# Patient Record
Sex: Female | Born: 1985 | Race: White | Hispanic: No | Smoking: Current every day smoker
Health system: Southern US, Community
[De-identification: ages and names within clinical notes are randomized; demographics above are authoritative.]

## PROBLEM LIST (undated history)

## (undated) DIAGNOSIS — F1020 Alcohol dependence, uncomplicated: Secondary | ICD-10-CM

## (undated) DIAGNOSIS — Z789 Other specified health status: Secondary | ICD-10-CM

## (undated) DIAGNOSIS — D649 Anemia, unspecified: Secondary | ICD-10-CM

## (undated) DIAGNOSIS — F419 Anxiety disorder, unspecified: Secondary | ICD-10-CM

## (undated) DIAGNOSIS — F32A Depression, unspecified: Secondary | ICD-10-CM

## (undated) DIAGNOSIS — E669 Obesity, unspecified: Secondary | ICD-10-CM

## (undated) HISTORY — DX: Obesity, unspecified: E66.9

## (undated) HISTORY — DX: Anemia, unspecified: D64.9

## (undated) HISTORY — DX: Depression, unspecified: F32.A

## (undated) HISTORY — PX: WISDOM TOOTH EXTRACTION: SHX21

## (undated) HISTORY — PX: NO PAST SURGERIES: SHX2092

## (undated) HISTORY — DX: Alcohol dependence, uncomplicated: F10.20

## (undated) HISTORY — DX: Anxiety disorder, unspecified: F41.9

---
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
## 2006-04-04 ENCOUNTER — Emergency Department (HOSPITAL_COMMUNITY): Admission: EM | Admit: 2006-04-04 | Discharge: 2006-04-05 | Payer: Self-pay | Admitting: Emergency Medicine

## 2006-09-07 ENCOUNTER — Emergency Department (HOSPITAL_COMMUNITY): Admission: EM | Admit: 2006-09-07 | Discharge: 2006-09-07 | Payer: Self-pay | Admitting: Emergency Medicine

## 2008-03-18 ENCOUNTER — Emergency Department (HOSPITAL_COMMUNITY): Admission: EM | Admit: 2008-03-18 | Discharge: 2008-03-18 | Payer: Self-pay | Admitting: Emergency Medicine

## 2008-03-20 ENCOUNTER — Emergency Department (HOSPITAL_COMMUNITY): Admission: EM | Admit: 2008-03-20 | Discharge: 2008-03-20 | Payer: Self-pay | Admitting: Emergency Medicine

## 2008-08-16 ENCOUNTER — Emergency Department (HOSPITAL_COMMUNITY): Admission: EM | Admit: 2008-08-16 | Discharge: 2008-08-16 | Payer: Self-pay | Admitting: Family Medicine

## 2008-08-18 ENCOUNTER — Emergency Department (HOSPITAL_COMMUNITY): Admission: EM | Admit: 2008-08-18 | Discharge: 2008-08-18 | Payer: Self-pay | Admitting: Emergency Medicine

## 2008-08-19 ENCOUNTER — Emergency Department (HOSPITAL_COMMUNITY): Admission: EM | Admit: 2008-08-19 | Discharge: 2008-08-19 | Payer: Self-pay | Admitting: *Deleted

## 2009-01-05 ENCOUNTER — Ambulatory Visit: Payer: Self-pay | Admitting: Infectious Diseases

## 2009-01-05 DIAGNOSIS — F411 Generalized anxiety disorder: Secondary | ICD-10-CM | POA: Insufficient documentation

## 2009-01-05 DIAGNOSIS — F32A Depression, unspecified: Secondary | ICD-10-CM | POA: Insufficient documentation

## 2009-01-05 DIAGNOSIS — J329 Chronic sinusitis, unspecified: Secondary | ICD-10-CM | POA: Insufficient documentation

## 2009-01-05 DIAGNOSIS — F329 Major depressive disorder, single episode, unspecified: Secondary | ICD-10-CM | POA: Insufficient documentation

## 2009-01-05 DIAGNOSIS — L659 Nonscarring hair loss, unspecified: Secondary | ICD-10-CM | POA: Insufficient documentation

## 2009-01-05 DIAGNOSIS — F419 Anxiety disorder, unspecified: Secondary | ICD-10-CM | POA: Insufficient documentation

## 2009-01-05 LAB — CONVERTED CEMR LAB
MCHC: 33.3 g/dL (ref 30.0–36.0)
RBC: 4.8 M/uL (ref 3.87–5.11)
RDW: 12.7 % (ref 11.5–15.5)
TSH: 1.103 microintl units/mL (ref 0.350–4.5)

## 2009-01-11 ENCOUNTER — Telehealth: Payer: Self-pay | Admitting: Licensed Clinical Social Worker

## 2009-01-23 ENCOUNTER — Encounter (INDEPENDENT_AMBULATORY_CARE_PROVIDER_SITE_OTHER): Payer: Self-pay | Admitting: Internal Medicine

## 2009-05-25 ENCOUNTER — Emergency Department (HOSPITAL_COMMUNITY): Admission: EM | Admit: 2009-05-25 | Discharge: 2009-05-25 | Payer: Self-pay | Admitting: Emergency Medicine

## 2009-05-26 ENCOUNTER — Emergency Department (HOSPITAL_COMMUNITY): Admission: EM | Admit: 2009-05-26 | Discharge: 2009-05-26 | Payer: Self-pay | Admitting: Family Medicine

## 2010-07-03 LAB — POCT RAPID STREP A (OFFICE): Streptococcus, Group A Screen (Direct): NEGATIVE

## 2010-10-07 IMAGING — CT CT HEAD W/O CM
1 series · 16 of 30 positions shown, 20 images · non-contrast
Comparison: None available

CLINICAL DATA: Headache

CT HEAD WITHOUT CONTRAST
TECHNIQUE: Contiguous axial images were obtained from the base of
the skull through the vertex without contrast.

[Series 2: head routine 4.8 h37s · axial · 0.43mm/px · z∈[-170,-8]mm · 16 of 36 slices shown, 20 images]
[im 2/36  brain]
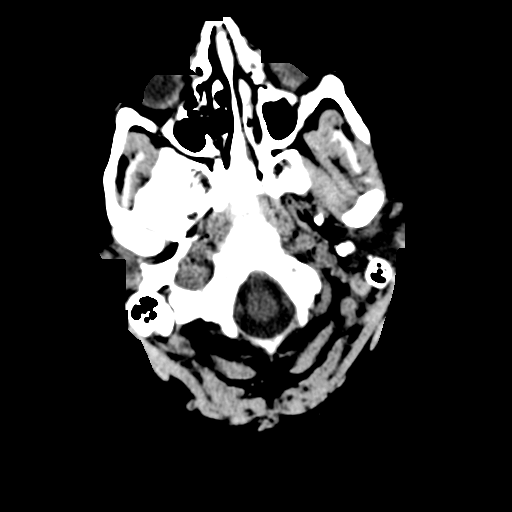
[im 2/36  bone]
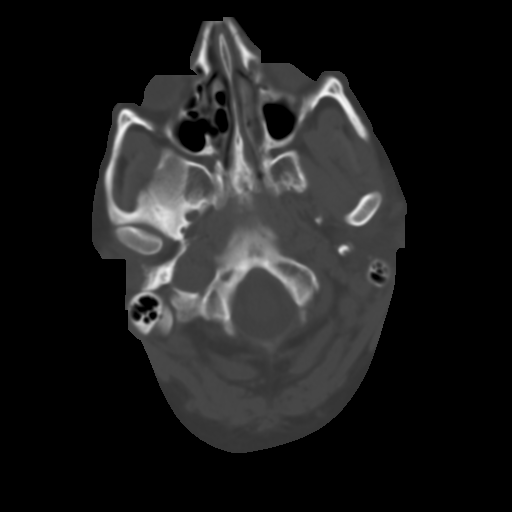
[im 4/36  brain]
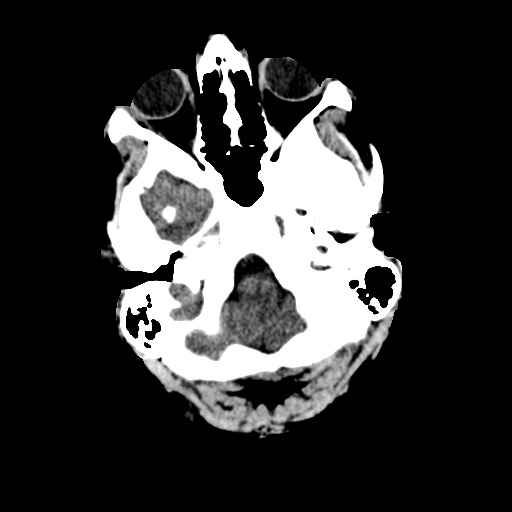
[im 7/36  brain]
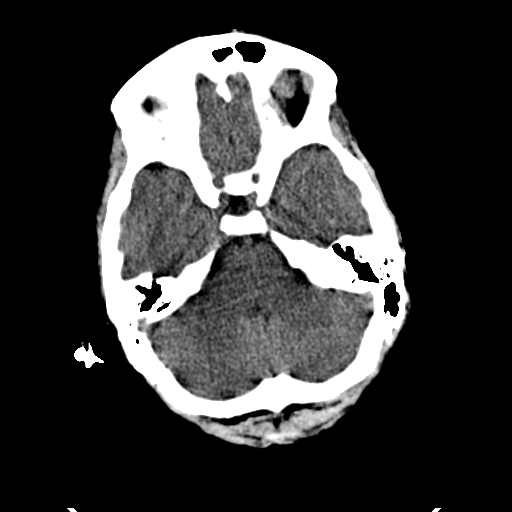
[im 9/36  brain]
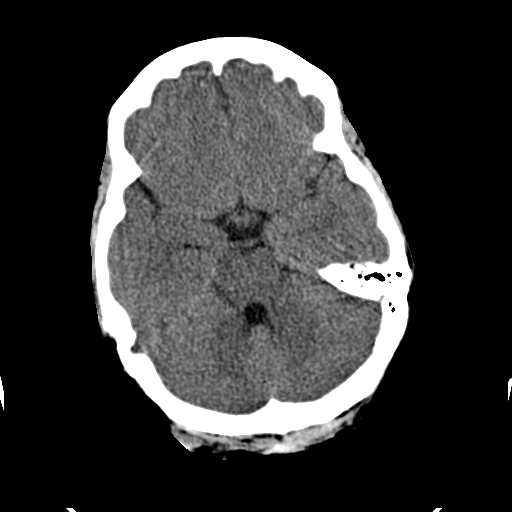
[im 10/36  brain]
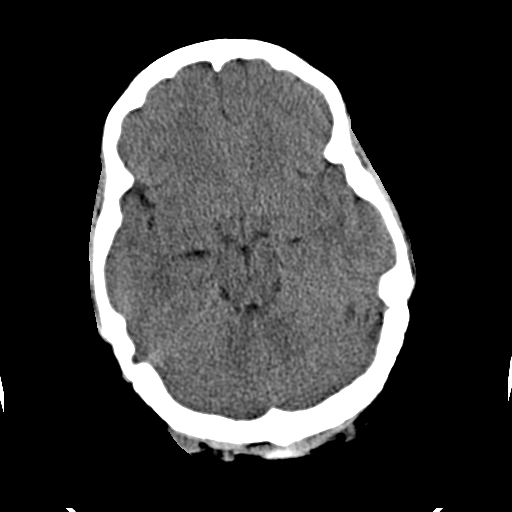
[im 10/36  bone]
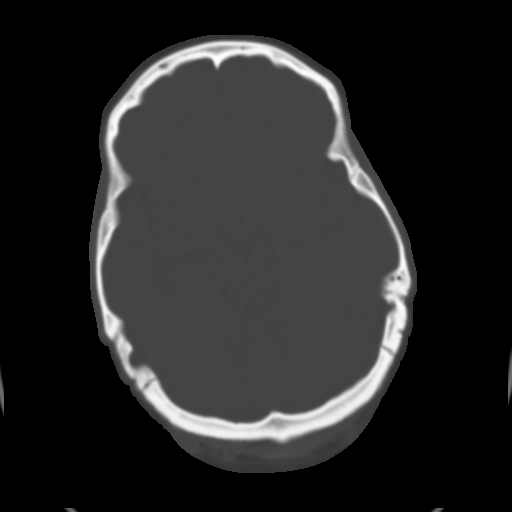
[im 13/36  brain]
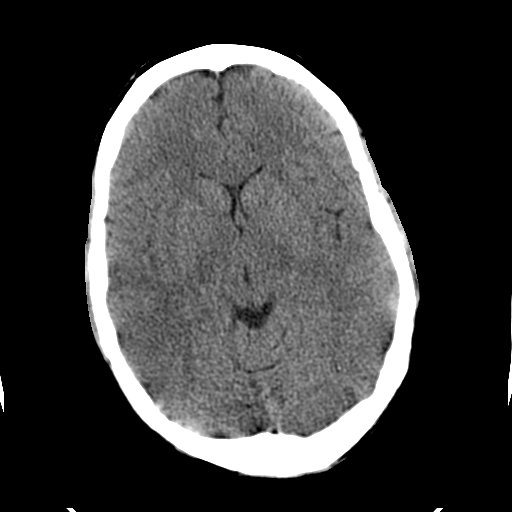
[im 15/36  brain]
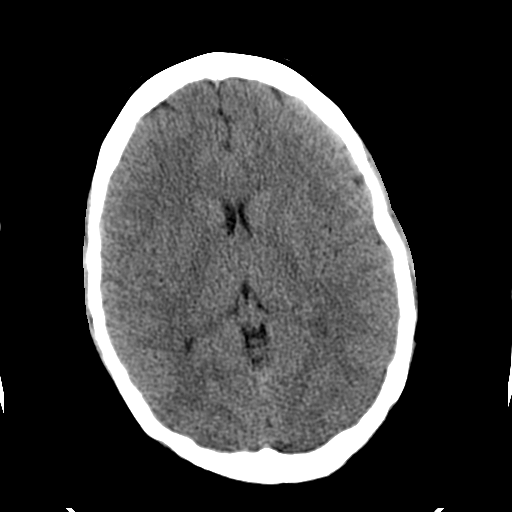
[im 17/36  brain]
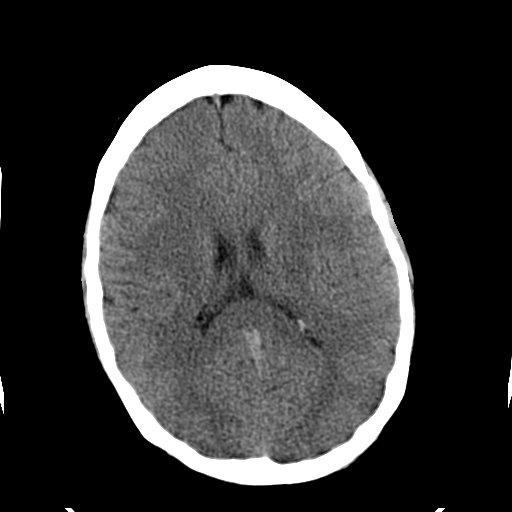
[im 19/36  brain]
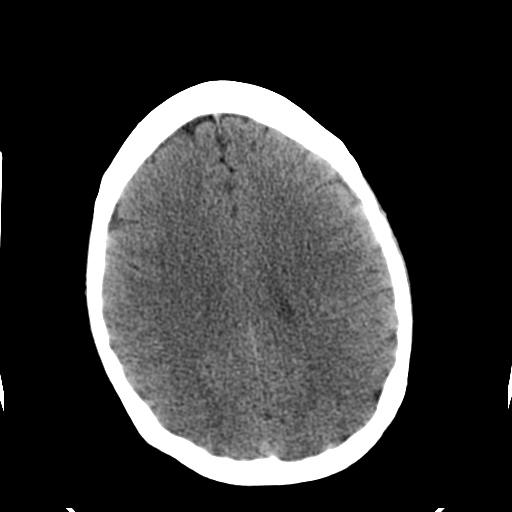
[im 19/36  bone]
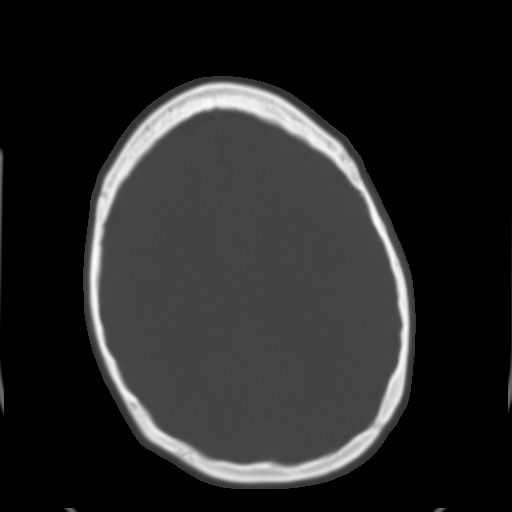
[im 21/36  brain]
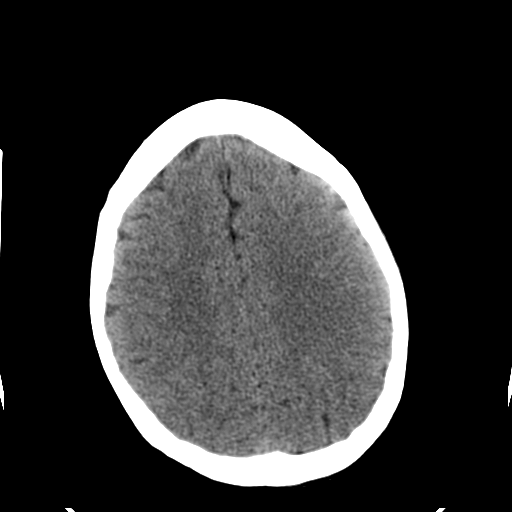
[im 23/36  brain]
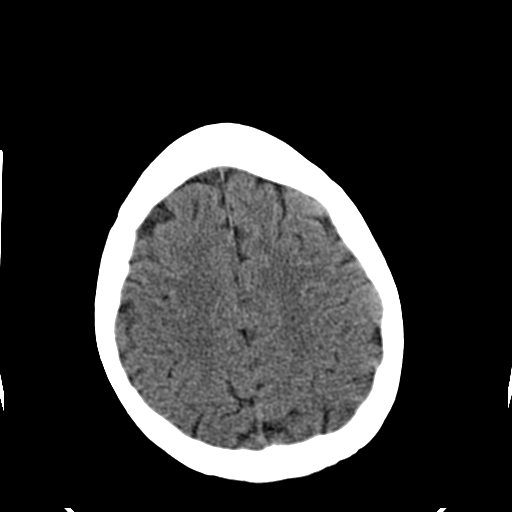
[im 26/36  brain]
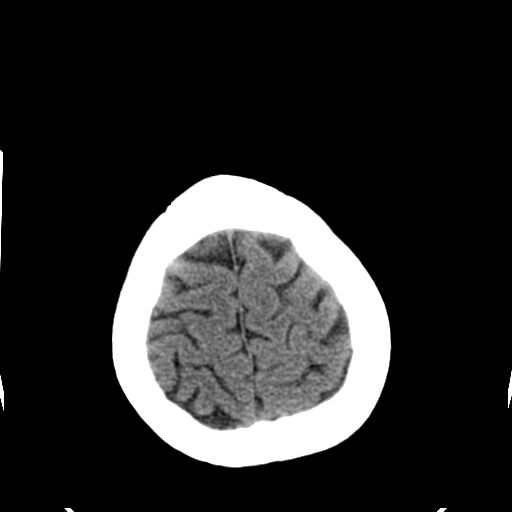
[im 27/36  brain]
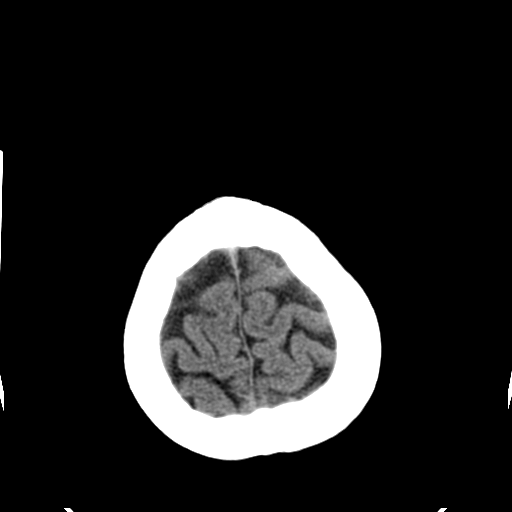
[im 27/36  bone]
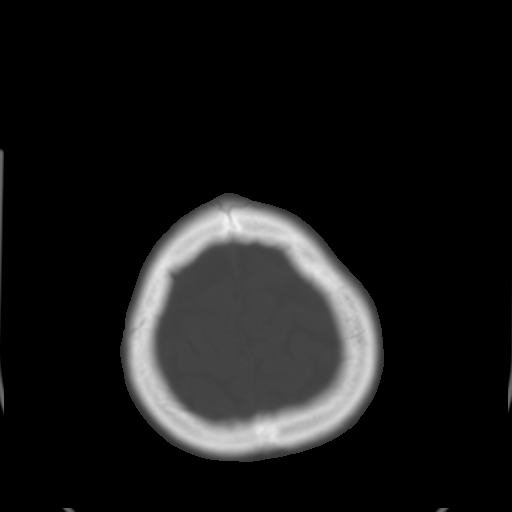
[im 29/36  brain]
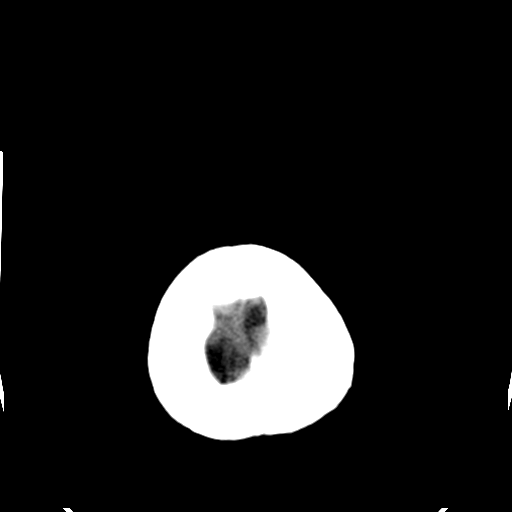
[im 32/36  brain]
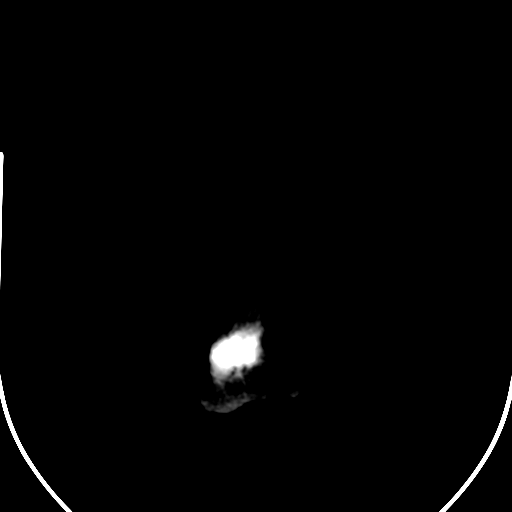
[im 34/36  brain]
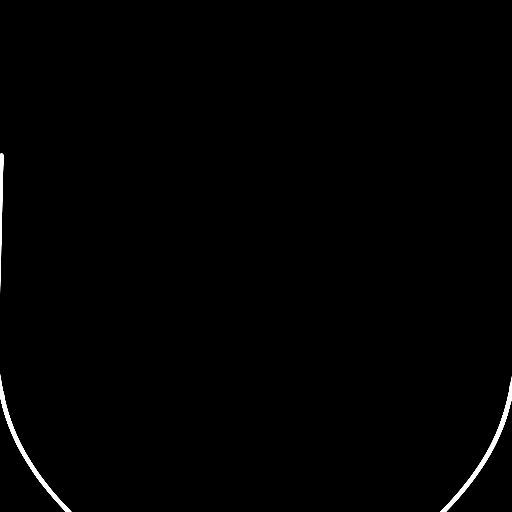

[16 of 30 positions shown; findings below may reference images not displayed]

FINDINGS: There is no evidence of acute intracranial hemorrhage,
brain edema, mass,  mass effect, or midline shift. Acute infarct
may be inapparent on noncontrast CT.  No other intra-axial
abnormalities are seen, and the ventricles and sulci are within
normal limits in size and symmetry.   No abnormal extra-axial fluid
collections or masses are identified.  No significant calvarial
abnormality.
IMPRESSION: Negative for bleed or other acute intracranial process.

## 2011-01-17 LAB — POCT URINALYSIS DIP (DEVICE)
Glucose, UA: NEGATIVE mg/dL
pH: 5.5 (ref 5.0–8.0)

## 2011-01-17 LAB — CBC
HCT: 40 % (ref 36.0–46.0)
Hemoglobin: 13.4 g/dL (ref 12.0–15.0)
Hemoglobin: 13.3 g/dL (ref 12.0–15.0)
MCV: 89.3 fL (ref 78.0–100.0)
Platelets: 230 10*3/uL (ref 150–400)
Platelets: 217 10*3/uL (ref 150–400)
RBC: 4.38 MIL/uL (ref 3.87–5.11)

## 2011-01-17 LAB — DIFFERENTIAL
Basophils Absolute: 0 10*3/uL (ref 0.0–0.1)
Basophils Absolute: 0 10*3/uL (ref 0.0–0.1)
Basophils Relative: 0 % (ref 0–1)
Basophils Relative: 1 % (ref 0–1)
Eosinophils Relative: 2 % (ref 0–5)
Lymphocytes Relative: 41 % (ref 12–46)
Monocytes Absolute: 0.3 10*3/uL (ref 0.1–1.0)
Monocytes Absolute: 0.3 10*3/uL (ref 0.1–1.0)
Monocytes Relative: 5 % (ref 3–12)
Neutrophils Relative %: 51 % (ref 43–77)

## 2011-01-17 LAB — URINALYSIS, ROUTINE W REFLEX MICROSCOPIC
Glucose, UA: NEGATIVE mg/dL
Nitrite: NEGATIVE
Urobilinogen, UA: 0.2 mg/dL (ref 0.0–1.0)

## 2011-01-17 LAB — BASIC METABOLIC PANEL
CO2: 26 mEq/L (ref 19–32)
Calcium: 9.1 mg/dL (ref 8.4–10.5)
GFR calc Af Amer: >60 mL/min (ref >60)
Sodium: 143 mEq/L (ref 135–145)

## 2011-01-17 LAB — COMPREHENSIVE METABOLIC PANEL
ALT: 16 U/L (ref 0–35)
Alkaline Phosphatase: 41 U/L (ref 39–117)
Chloride: 105 mEq/L (ref 96–112)
GFR calc Af Amer: >60 mL/min (ref >60)
GFR calc non Af Amer: >60 mL/min (ref >60)
Potassium: 3.1 mEq/L — ABNORMAL LOW (ref 3.5–5.1)
Total Bilirubin: 0.4 mg/dL (ref 0.3–1.2)
Total Protein: 7.2 g/dL (ref 6.0–8.3)

## 2011-01-17 LAB — URINE MICROSCOPIC-ADD ON

## 2011-01-17 LAB — LIPASE, BLOOD: Lipase: 20 U/L (ref 11–59)

## 2011-01-17 LAB — PREGNANCY, URINE: Preg Test, Ur: NEGATIVE

## 2011-01-17 LAB — POCT PREGNANCY, URINE: Preg Test, Ur: NEGATIVE

## 2011-03-07 IMAGING — CR DG CHEST 2V
2 series · 2 of 2 positions shown · non-contrast
Comparison: None

CLINICAL DATA: Shortness of breath

CHEST - 2 VIEW

[w chest pa]
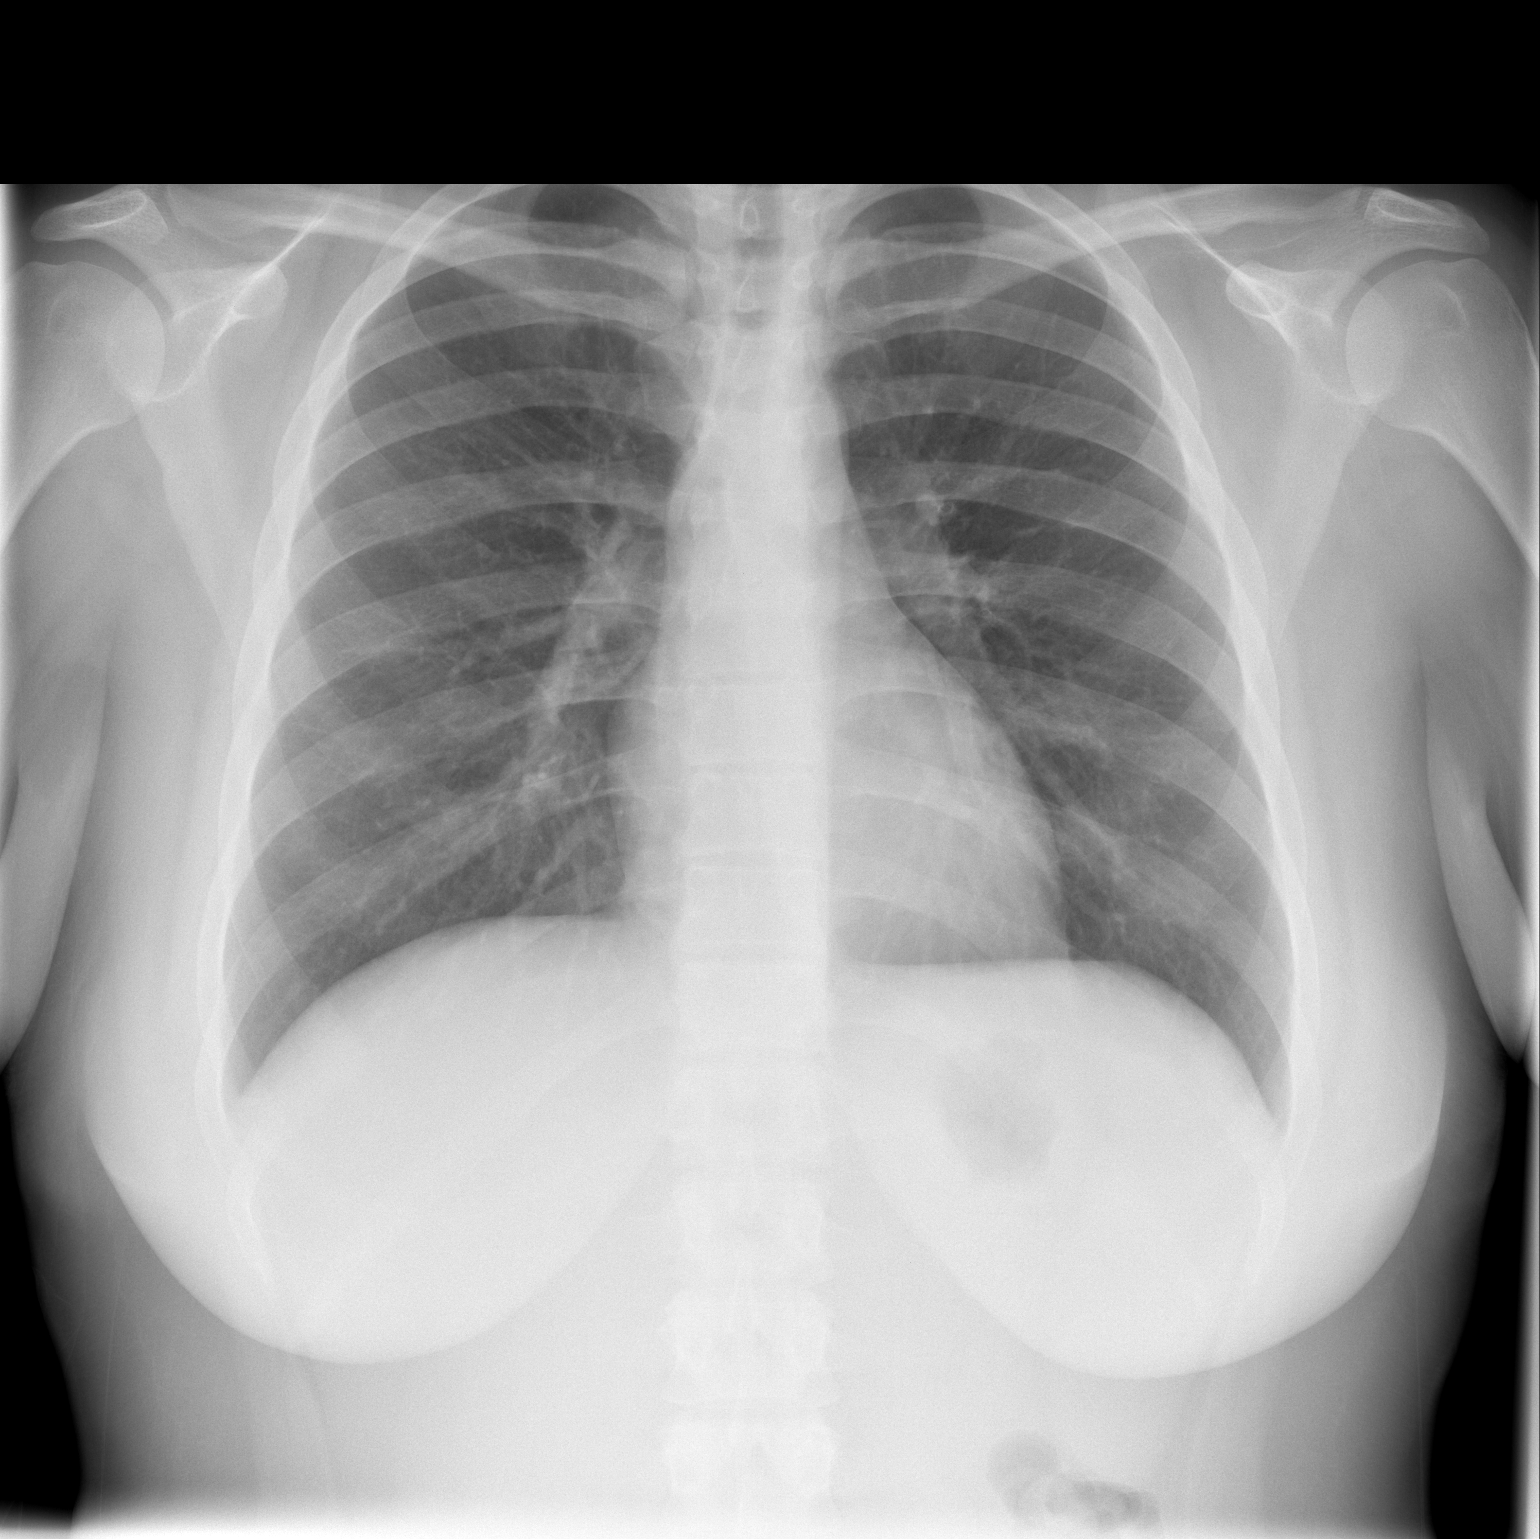

[w chest lat]
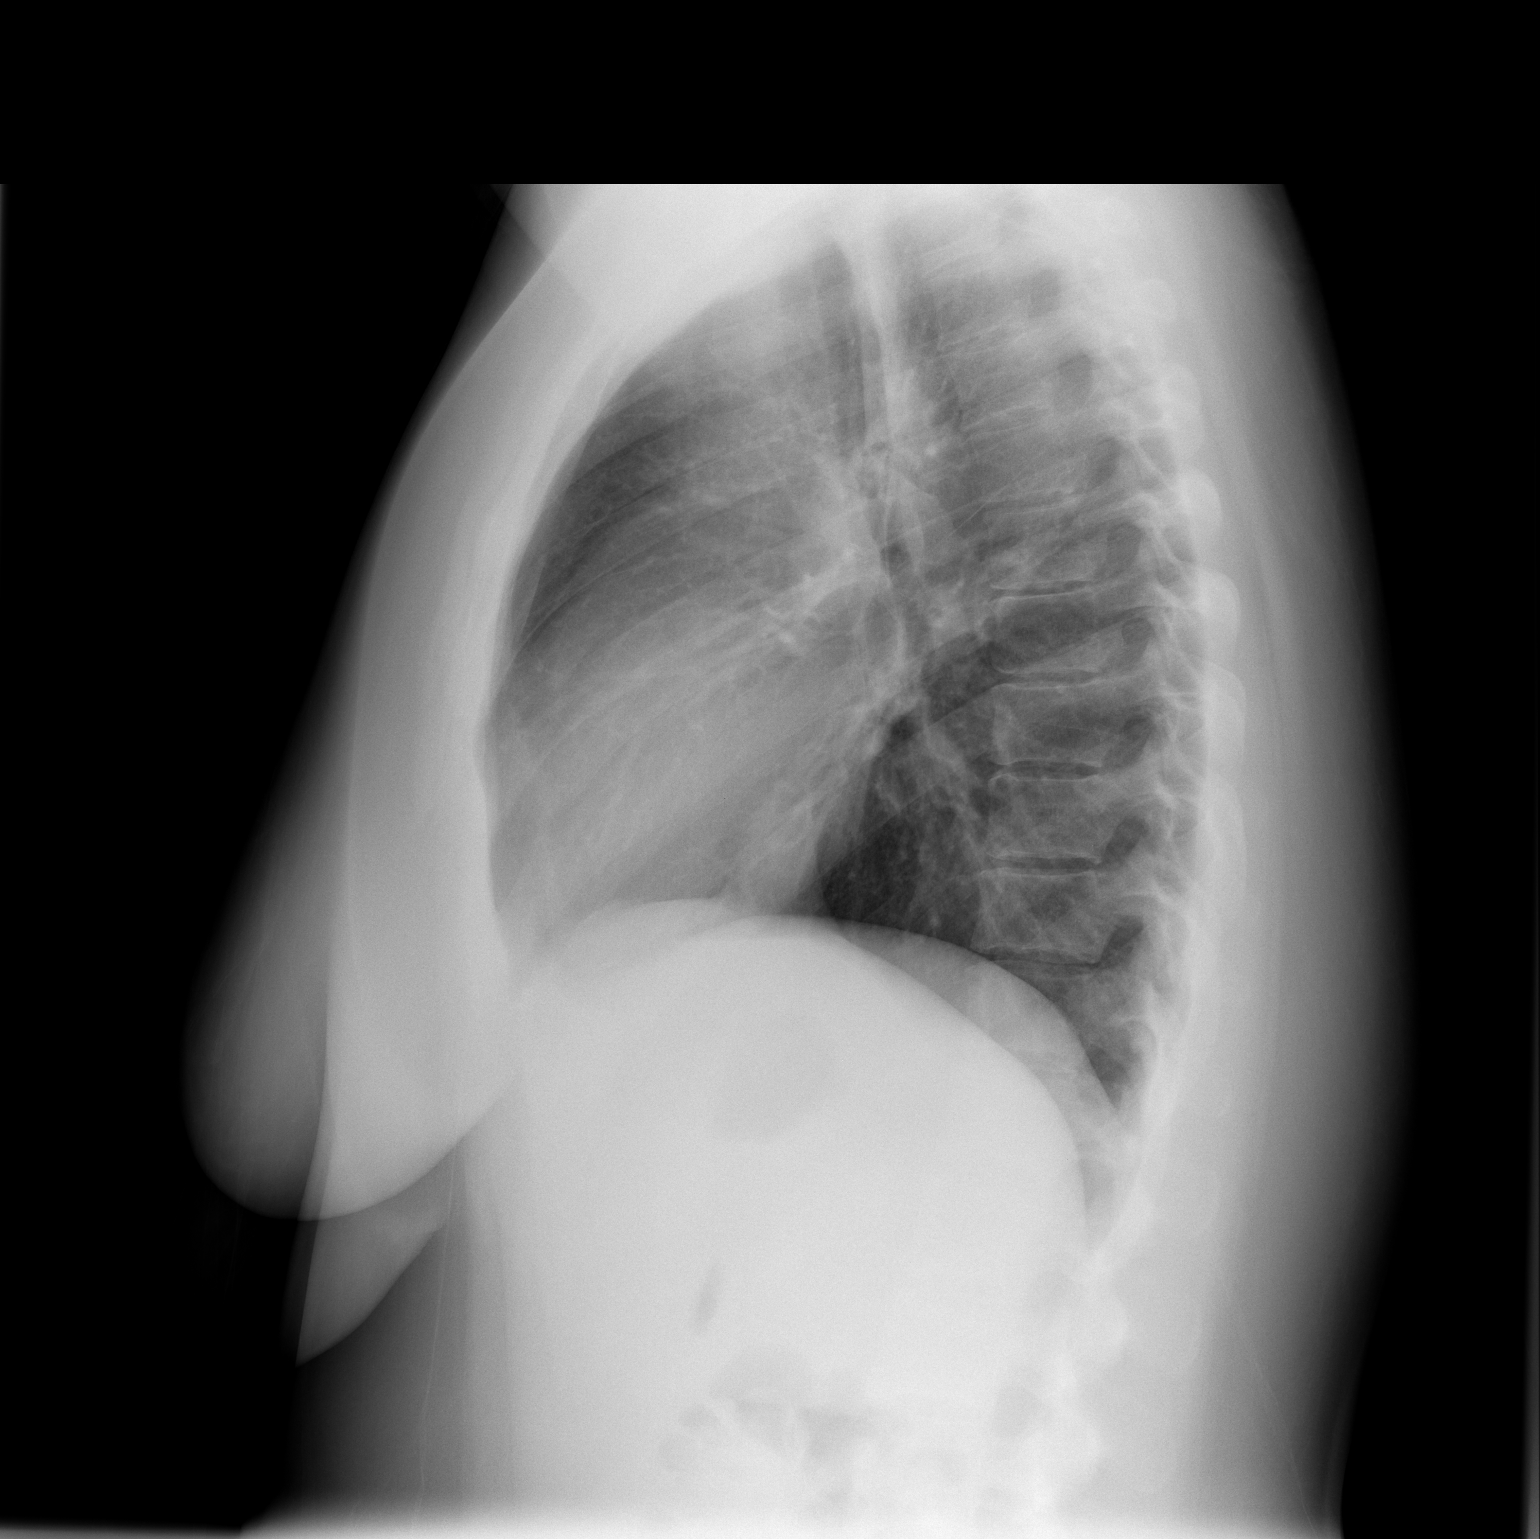

[2 of 2 positions shown; findings below may reference images not displayed]

FINDINGS: Cardiomediastinal silhouette is unremarkable.  No acute
infiltrate or pleural effusion.  No pulmonary edema.  Bony thorax
is unremarkable.  There is no pneumothorax.
IMPRESSION: No active disease.

## 2019-03-12 ENCOUNTER — Emergency Department (HOSPITAL_COMMUNITY)
Admission: EM | Admit: 2019-03-12 | Discharge: 2019-03-13 | Disposition: A | Discharge status: Home / Self Care | Payer: Self-pay | Attending: Emergency Medicine | Admitting: Emergency Medicine

## 2019-03-12 DIAGNOSIS — Z5321 Procedure and treatment not carried out due to patient leaving prior to being seen by health care provider: Secondary | ICD-10-CM | POA: Insufficient documentation

## 2019-03-12 DIAGNOSIS — R1110 Vomiting, unspecified: Secondary | ICD-10-CM | POA: Insufficient documentation

## 2019-03-12 DIAGNOSIS — R5383 Other fatigue: Secondary | ICD-10-CM | POA: Insufficient documentation

## 2019-03-12 DIAGNOSIS — R531 Weakness: Secondary | ICD-10-CM | POA: Insufficient documentation

## 2019-03-13 ENCOUNTER — Other Ambulatory Visit: Payer: Self-pay

## 2019-03-13 LAB — BASIC METABOLIC PANEL
Anion gap: 14 (ref 5–15)
BUN: 6 mg/dL (ref 6–20)
CO2: 24 mmol/L (ref 22–32)
Calcium: 9.2 mg/dL (ref 8.9–10.3)
Chloride: 107 mmol/L (ref 98–111)
Creatinine, Ser: 0.59 mg/dL (ref 0.44–1.00)
GFR calc Af Amer: >60 mL/min (ref >60)
GFR calc non Af Amer: >60 mL/min (ref >60)
Glucose, Bld: 85 mg/dL (ref 70–99)
Potassium: 4.1 mmol/L (ref 3.5–5.1)
Sodium: 145 mmol/L (ref 135–145)

## 2019-03-13 LAB — URINALYSIS, ROUTINE W REFLEX MICROSCOPIC
Bilirubin Urine: NEGATIVE
Glucose, UA: NEGATIVE mg/dL
Hgb urine dipstick: NEGATIVE
Ketones, ur: NEGATIVE mg/dL
Leukocytes,Ua: NEGATIVE
Nitrite: NEGATIVE
Protein, ur: NEGATIVE mg/dL
Specific Gravity, Urine: 1.005 (ref 1.005–1.030)
pH: 7 (ref 5.0–8.0)

## 2019-03-13 LAB — CBC
HCT: 37.7 % (ref 36.0–46.0)
Hemoglobin: 12.3 g/dL (ref 12.0–15.0)
MCH: 30 pg (ref 26.0–34.0)
MCHC: 32.6 g/dL (ref 30.0–36.0)
MCV: 92 fL (ref 80.0–100.0)
Platelets: 222 10*3/uL (ref 150–400)
RBC: 4.1 MIL/uL (ref 3.87–5.11)
RDW: 14 % (ref 11.5–15.5)
WBC: 3.5 10*3/uL — ABNORMAL LOW (ref 4.0–10.5)
nRBC: 0 % (ref 0.0–0.2)

## 2019-03-13 LAB — I-STAT BETA HCG BLOOD, ED (MC, WL, AP ONLY): I-stat hCG, quantitative: <5 m[IU]/mL (ref <5)

## 2019-03-13 MED ORDER — SODIUM CHLORIDE 0.9% FLUSH: 3.0000 mL | Freq: Once | INTRAVENOUS | Status: DC

## 2019-03-13 NOTE — ED Notes (Signed)
Pt advised she was leaving the ER. Pt also stated the wait was too long.

## 2019-03-13 NOTE — ED Triage Notes (Signed)
Per pt she has been having weakness and fatigue that has been progressing for a bout 1 month. Pt said it is getting worse and having to take naps 4 to 5 times a day. No energy loss of appetite. Pt said tonight she woke up with vomit in her mouth that was full of blood.No abdominal pain, no nausea.

## 2019-04-15 NOTE — L&D Delivery Note (Signed)
Delivery Note At 2:26 AM a viable female was delivered via  (Presentation: ROA).  APGAR: 8, 9; weight  2710g.   Placenta status: intact.  Cord: 3v without complications    Anesthesia: epidural Episiotomy:  none Lacerations:  Bilateral labial, hemostatic without repair, bilateral sulcal repaired Suture Repair: 3.0 Est. Blood Loss (mL): 231   Mom to postpartum.  Baby to Couplet care / Skin to Skin.  Delivery: Called to room and patient was complete and pushing. Head delivered ROA. No nuchal cord present. Shoulder and body delivered in usual fashion. Infant with spontaneous cry, placed on mother's abdomen, dried and stimulated. Cord clamped x 2 after 1-minute delay, and cut by FOB under my direct supervision. Cord blood drawn. Placenta delivered spontaneously with gentle cord traction. Fundus firm with massage and Pitocin. Labia, perineum, vagina, and cervix were inspected, as noted above.   Alric Seton, MD OB Fellow, [facility], Center for [facility] 12/01/2019 3:38 AM

## 2019-09-07 ENCOUNTER — Encounter: Payer: Self-pay | Admitting: Obstetrics & Gynecology

## 2019-09-07 ENCOUNTER — Ambulatory Visit: Payer: Self-pay | Admitting: Obstetrics & Gynecology

## 2019-09-23 ENCOUNTER — Encounter: Payer: Self-pay | Admitting: Obstetrics and Gynecology

## 2019-09-23 ENCOUNTER — Ambulatory Visit (INDEPENDENT_AMBULATORY_CARE_PROVIDER_SITE_OTHER): Payer: Medicaid Other | Admitting: Obstetrics and Gynecology

## 2019-09-23 ENCOUNTER — Other Ambulatory Visit: Payer: Self-pay

## 2019-09-23 DIAGNOSIS — N889 Noninflammatory disorder of cervix uteri, unspecified: Secondary | ICD-10-CM

## 2019-09-23 DIAGNOSIS — R87612 Low grade squamous intraepithelial lesion on cytologic smear of cervix (LGSIL): Secondary | ICD-10-CM | POA: Insufficient documentation

## 2019-09-23 DIAGNOSIS — N879 Dysplasia of cervix uteri, unspecified: Secondary | ICD-10-CM

## 2019-09-26 ENCOUNTER — Encounter: Payer: Self-pay | Admitting: Obstetrics and Gynecology

## 2019-09-26 DIAGNOSIS — N889 Noninflammatory disorder of cervix uteri, unspecified: Secondary | ICD-10-CM | POA: Insufficient documentation

## 2019-09-26 HISTORY — PX: COLPOSCOPY: PRO47

## 2019-09-26 NOTE — Procedures (Signed)
Colposcopy Procedure Note 09/23/2019  Pre-operative Diagnosis: 25 weeks pregnancy. July 18, 2019 LSIL pap. August 10, 2019 colposcopy with white lesion seens  Post-operative Diagnosis: Same  Procedure Details  The risks (including infection, bleeding, pain) and benefits of the procedure were explained to the patient and written informed consent was obtained.  The patient was placed in the dorsal lithotomy position. A Graves was speculum inserted in the vagina, and the cervix was visualized.  AA staining done Lugol's with green filter.  Biopsy not done. No bleeding after procedure  Findings: circumferental AWE changes with approximately 1.5 x 1.5 cm white area at the 4 o'clock area of the TZ. Area appears c/w with low grade changes; non friable.   Adequate: Yes  Specimens: None  Condition: Stable  Complications: None  Plan: The patient was advised to call for any fever or for prolonged or severe pain or bleeding. She was advised to use OTC analgesics as needed for mild to moderate pain. She was advised to avoid vaginal intercourse for 48 hours or until the bleeding has completely stopped.  I told her I recommend that she have her postpartum visit with [place], here at the [facility], and we can do a colposcopy at that time and biopsy the area then.   Cornelia Copa MD Attending Center for [employer] [facility])

## 2019-11-08 ENCOUNTER — Other Ambulatory Visit: Payer: Self-pay

## 2019-11-08 ENCOUNTER — Inpatient Hospital Stay (HOSPITAL_COMMUNITY)
Admission: AD | Admit: 2019-11-08 | Discharge: 2019-11-08 | Disposition: A | Discharge status: Home / Self Care | Payer: Medicaid Other | Attending: Obstetrics and Gynecology | Admitting: Obstetrics and Gynecology

## 2019-11-08 ENCOUNTER — Encounter (HOSPITAL_COMMUNITY): Payer: Self-pay | Admitting: Obstetrics and Gynecology

## 2019-11-08 DIAGNOSIS — O09899 Supervision of other high risk pregnancies, unspecified trimester: Secondary | ICD-10-CM

## 2019-11-08 DIAGNOSIS — Z87891 Personal history of nicotine dependence: Secondary | ICD-10-CM | POA: Insufficient documentation

## 2019-11-08 DIAGNOSIS — O4703 False labor before 37 completed weeks of gestation, third trimester: Secondary | ICD-10-CM

## 2019-11-08 DIAGNOSIS — Z3689 Encounter for other specified antenatal screening: Secondary | ICD-10-CM

## 2019-11-08 DIAGNOSIS — Z3A34 34 weeks gestation of pregnancy: Secondary | ICD-10-CM | POA: Diagnosis not present

## 2019-11-08 DIAGNOSIS — F1290 Cannabis use, unspecified, uncomplicated: Secondary | ICD-10-CM

## 2019-11-08 HISTORY — DX: Other specified health status: Z78.9

## 2019-11-08 LAB — RAPID URINE DRUG SCREEN, HOSP PERFORMED
Amphetamines: NOT DETECTED
Barbiturates: NOT DETECTED
Benzodiazepines: NOT DETECTED
Cocaine: NOT DETECTED
Opiates: NOT DETECTED
Tetrahydrocannabinol: POSITIVE — AB

## 2019-11-08 LAB — URINALYSIS, ROUTINE W REFLEX MICROSCOPIC
Bilirubin Urine: NEGATIVE
Glucose, UA: NEGATIVE mg/dL
Hgb urine dipstick: NEGATIVE
Ketones, ur: NEGATIVE mg/dL
Nitrite: NEGATIVE
Protein, ur: NEGATIVE mg/dL
Specific Gravity, Urine: 1.006 (ref 1.005–1.030)
pH: 8 (ref 5.0–8.0)

## 2019-11-08 LAB — WET PREP, GENITAL
Sperm: NONE SEEN
Trich, Wet Prep: NONE SEEN
Yeast Wet Prep HPF POC: NONE SEEN

## 2019-11-08 MED ORDER — LACTATED RINGERS IV BOLUS
1000.0000 mL | Freq: Once | INTRAVENOUS | Status: AC
  Administered 2019-11-08: 1000 mL via INTRAVENOUS

## 2019-11-08 MED ORDER — ACETAMINOPHEN 500 MG PO TABS
1000.0000 mg | ORAL_TABLET | Freq: Once | ORAL | Status: AC
  Administered 2019-11-08: 1000 mg via ORAL
  Filled 2019-11-08: qty 2

## 2019-11-08 NOTE — MAU Provider Note (Addendum)
History     CSN: [id number]  Arrival date and time: 11/08/19 1202   First Provider Initiated Contact with Patient 11/08/19 1239      Chief Complaint  Patient presents with  . Contractions   HPI Yvonne Grant is a 34 y.o. G1P0 at [redacted]w[redacted]d who presents to MAU from [facility] with chief complaint of preterm contractions. This is a recurrent problem, onset two weeks ago. Patient states she reported increasing contraction frequency during her appointment earlier today and was advised to report to MAU for evaluation of preterm contractions. She denies vaginal bleeding, leaking of fluid, decreased fetal movement, fever, falls, or recent illness. She denies contractions on arrival to MAU and reports pain score of 0/10.    OB History    Gravida  1   Para      Term      Preterm      AB      Living        SAB      TAB      Ectopic      Multiple      Live Births              Past Medical History:  Diagnosis Date  . Medical history non-contributory     Past Surgical History:  Procedure Laterality Date  . COLPOSCOPY  09/26/2019      . NO PAST SURGERIES      Family History  Problem Relation Age of Onset  . Diabetes Maternal Grandmother     Social History   Tobacco Use  . Smoking status: Former Smoker    Quit date: 06/10/2018    Years since quitting: 1.4  . Smokeless tobacco: Former Engineer, water Use Topics  . Alcohol use: Not Currently  . Drug use: Yes    Types: Marijuana    Comment: last time she used was last week    Allergies: No Known Allergies  No medications prior to admission.    Review of Systems  Constitutional: Negative for fever.  Respiratory: Negative for shortness of breath.   Gastrointestinal: Negative for abdominal pain.  Genitourinary: Negative for vaginal bleeding.  Musculoskeletal: Negative for back pain.  All other systems reviewed and are negative.  Physical Exam   Blood pressure (!) 118/62, pulse 73, temperature 98.2 F  (36.8 C), temperature source Oral, resp. rate 17, height 5\' 8"  (1.727 m), weight 82.1 kg, SpO2 100 %.  Physical Exam Vitals and nursing note reviewed. Exam conducted with a chaperone present.  Cardiovascular:     Rate and Rhythm: Normal rate.     Pulses: Normal pulses.     Heart sounds: Normal heart sounds.  Pulmonary:     Effort: Pulmonary effort is normal.     Breath sounds: Normal breath sounds.  Abdominal:     Comments: Gravid  Skin:    General: Skin is warm and dry.     Capillary Refill: Capillary refill takes less than 2 seconds.  Neurological:     General: No focal deficit present.  Psychiatric:        Mood and Affect: Mood normal.        Behavior: Behavior normal.        Thought Content: Thought content normal.     MAU Course/MDM  Procedures   --Reactive tracing: baseline 125, moderate variability, positive accels, no decels --Toco: quiet --Pain score 0/10 throughout evaluation in MAU --Cervix 1/thick and long/ballotable on initial exam, unchanged two hours later --  Discussed incidental finding of 1cm cervix in setting of 0 pain, no contractions on monitor, low suspicion for preterm labor --POS clue cells, not c/w physical exam or patient report, will defer treatment --Abnormal UA, discussed initiating treatment vs waiting for culture. Patient prefers to wait for culture  Patient Vitals for the past 24 hrs:  BP Temp Temp src Pulse Resp SpO2 Height Weight  11/08/19 1447 112/70 -- -- 55 16 -- -- --  11/08/19 1217 (!) 118/62 98.2 F (36.8 C) Oral 73 17 100 % 5\' 8"  (1.727 m) 82.1 kg   Orders Placed This Encounter  Procedures  . Wet prep, genital  . Culture, OB Urine  . Urinalysis, Routine w reflex microscopic  . Rapid urine drug screen (hospital performed)  . Insert peripheral IV  . Discharge patient   Results for orders placed or performed during the hospital encounter of 11/08/19 (from the past 24 hour(s))  Urinalysis, Routine w reflex microscopic     Status:  Abnormal   Collection Time: 11/08/19 12:20 PM  Result Value Ref Range   Color, Urine YELLOW YELLOW   APPearance HAZY (A) CLEAR   Specific Gravity, Urine 1.006 1.005 - 1.030   pH 8.0 5.0 - 8.0   Glucose, UA NEGATIVE NEGATIVE mg/dL   Hgb urine dipstick NEGATIVE NEGATIVE   Bilirubin Urine NEGATIVE NEGATIVE   Ketones, ur NEGATIVE NEGATIVE mg/dL   Protein, ur NEGATIVE NEGATIVE mg/dL   Nitrite NEGATIVE NEGATIVE   Leukocytes,Ua MODERATE (A) NEGATIVE   RBC / HPF 0-5 0 - 5 RBC/hpf   WBC, UA 0-5 0 - 5 WBC/hpf   Bacteria, UA RARE (A) NONE SEEN   Squamous Epithelial / LPF 0-5 0 - 5  Rapid urine drug screen (hospital performed)     Status: Abnormal   Collection Time: 11/08/19 12:20 PM  Result Value Ref Range   Opiates NONE DETECTED NONE DETECTED   Cocaine NONE DETECTED NONE DETECTED   Benzodiazepines NONE DETECTED NONE DETECTED   Amphetamines NONE DETECTED NONE DETECTED   Tetrahydrocannabinol POSITIVE (A) NONE DETECTED   Barbiturates NONE DETECTED NONE DETECTED  Wet prep, genital     Status: Abnormal   Collection Time: 11/08/19 12:49 PM  Result Value Ref Range   Yeast Wet Prep HPF POC NONE SEEN NONE SEEN   Trich, Wet Prep NONE SEEN NONE SEEN   Clue Cells Wet Prep HPF POC PRESENT (A) NONE SEEN   WBC, Wet Prep HPF POC MANY (A) NONE SEEN   Sperm NONE SEEN    Assessment and Plan  --34 y.o. G1P0 at [redacted]w[redacted]d  --Reactive tracing --No contractions or pain observed during two hours evaluation --+ THC --Urine culture in work --Discharge home in stable condition  F/U: --Next appt with [facility] is Tuesday 11/15/2019  01/15/2020, CNM 11/08/2019, 5:02 PM

## 2019-11-08 NOTE — Discharge Instructions (Signed)

## 2019-11-08 NOTE — MAU Note (Signed)
Sent from [facility], noted an increase in contractions the past 2 wks.. was put on monitor , q 8-12 min, short in duration.  No bleeding or leaking.

## 2019-11-09 LAB — GC/CHLAMYDIA PROBE AMP (~~LOC~~) NOT AT ARMC
Chlamydia: NEGATIVE
Comment: NEGATIVE
Comment: NORMAL
Neisseria Gonorrhea: NEGATIVE

## 2019-11-09 LAB — CULTURE, OB URINE: Culture: NO GROWTH

## 2019-11-28 ENCOUNTER — Inpatient Hospital Stay (HOSPITAL_COMMUNITY)
Admission: AD | Admit: 2019-11-28 | Discharge: 2019-11-28 | Disposition: A | Discharge status: Home / Self Care | Payer: Medicaid Other | Attending: Obstetrics and Gynecology | Admitting: Obstetrics and Gynecology

## 2019-11-28 ENCOUNTER — Other Ambulatory Visit: Payer: Self-pay

## 2019-11-28 DIAGNOSIS — Z3A37 37 weeks gestation of pregnancy: Secondary | ICD-10-CM | POA: Insufficient documentation

## 2019-11-28 DIAGNOSIS — O479 False labor, unspecified: Secondary | ICD-10-CM

## 2019-11-28 DIAGNOSIS — Z0371 Encounter for suspected problem with amniotic cavity and membrane ruled out: Secondary | ICD-10-CM | POA: Diagnosis not present

## 2019-11-28 DIAGNOSIS — O471 False labor at or after 37 completed weeks of gestation: Secondary | ICD-10-CM | POA: Insufficient documentation

## 2019-11-28 LAB — POCT FERN TEST: POCT Fern Test: NEGATIVE

## 2019-11-28 NOTE — MAU Provider Note (Signed)
  S: Ms. Yvonne Grant is a 34 y.o. G1P0 at [redacted]w[redacted]d  who presents to MAU today complaining of painful contractions since 0800. She denies bleeding as well as leaking of fluid. She reports normal fetal movement.    O: BP 123/67   Pulse (!) 56   Temp 98.6 F (37 C)   Resp 16   SpO2 100%    GENERAL: Well-developed, well-nourished female in no acute distress.  HEAD: Normocephalic, atraumatic.  CHEST: Normal effort of breathing, regular heart rate ABDOMEN: Soft, nontender, gravid PELVIC: Normal external female genitalia. Vagina is pink and rugated. Cervix with normal contour, no lesions. Normal discharge.  Neg pooling.   Cervical exam: 3-4   Fetal Monitoring: Baseline: 130 Variability: Mod Accelerations: POS 15 x 15 Decelerations: N/A Contractions: Irregular q 3-5, palpate mild, patient able to laugh and engage in conversation during ctx  A: SIUP at [redacted]w[redacted]d  Rule out rupture initiated for well-applied fetal head, no palpable sac Membranes intact (Negative pooling, Negative Amnisure) Cervix remains 3-3.5 one hour after initial exam Patient requests discharge home vs additional hour of monitoring    P: Intact amniotic sac Reactive tracing Discharge home in stable condition with labor precautions  S. [name], [place] 11/28/2019 3:06 PM

## 2019-11-28 NOTE — MAU Note (Signed)
.   Yvonne Grant is a 34 y.o. at [redacted]w[redacted]d here in MAU reporting: lower abdominal cramping since this morning. Denies any VB or LOF  Onset of complaint:8am Pain score:  Vitals:   11/28/19 1256  BP: 123/67  Pulse: (!) 56  Resp: 16  Temp: 98.6 F (37 C)  SpO2: 100%     FHT:170 Lab orders placed from triage: UA

## 2019-11-28 NOTE — Discharge Instructions (Signed)
Braxton Hicks Contractions °Contractions of the uterus can occur throughout pregnancy, but they are not always a sign that you are in labor. You may have practice contractions called Braxton Hicks contractions. These false labor contractions are sometimes confused with true labor. °What are Braxton Hicks contractions? °Braxton Hicks contractions are tightening movements that occur in the muscles of the uterus before labor. Unlike true labor contractions, these contractions do not result in opening (dilation) and thinning of the cervix. Toward the end of pregnancy (32-34 weeks), Braxton Hicks contractions can happen more often and may become stronger. These contractions are sometimes difficult to tell apart from true labor because they can be very uncomfortable. You should not feel embarrassed if you go to the hospital with false labor. °Sometimes, the only way to tell if you are in true labor is for your health care provider to look for changes in the cervix. The health care provider will do a physical exam and may monitor your contractions. If you are not in true labor, the exam should show that your cervix is not dilating and your water has not broken. °If there are no other health problems associated with your pregnancy, it is completely safe for you to be sent home with false labor. You may continue to have Braxton Hicks contractions until you go into true labor. °How to tell the difference between true labor and false labor °True labor °· Contractions last 30-70 seconds. °· Contractions become very regular. °· Discomfort is usually felt in the top of the uterus, and it spreads to the lower abdomen and low back. °· Contractions do not go away with walking. °· Contractions usually become more intense and increase in frequency. °· The cervix dilates and gets thinner. °False labor °· Contractions are usually shorter and not as strong as true labor contractions. °· Contractions are usually irregular. °· Contractions  are often felt in the front of the lower abdomen and in the groin. °· Contractions may go away when you walk around or change positions while lying down. °· Contractions get weaker and are shorter-lasting as time goes on. °· The cervix usually does not dilate or become thin. °Follow these instructions at home: ° °· Take over-the-counter and prescription medicines only as told by your health care provider. °· Keep up with your usual exercises and follow other instructions from your health care provider. °· Eat and drink lightly if you think you are going into labor. °· If Braxton Hicks contractions are making you uncomfortable: °? Change your position from lying down or resting to walking, or change from walking to resting. °? Sit and rest in a tub of warm water. °? Drink enough fluid to keep your urine pale yellow. Dehydration may cause these contractions. °? Do slow and deep breathing several times an hour. °· Keep all follow-up prenatal visits as told by your health care provider. This is important. °Contact a health care provider if: °· You have a fever. °· You have continuous pain in your abdomen. °Get help right away if: °· Your contractions become stronger, more regular, and closer together. °· You have fluid leaking or gushing from your vagina. °· You pass blood-tinged mucus (bloody show). °· You have bleeding from your vagina. °· You have low back pain that you never had before. °· You feel your baby’s head pushing down and causing pelvic pressure. °· Your baby is not moving inside you as much as it used to. °Summary °· Contractions that occur before labor are   called Braxton Hicks contractions, false labor, or practice contractions. °· Braxton Hicks contractions are usually shorter, weaker, farther apart, and less regular than true labor contractions. True labor contractions usually become progressively stronger and regular, and they become more frequent. °· Manage discomfort from Braxton Hicks contractions  by changing position, resting in a warm bath, drinking plenty of water, or practicing deep breathing. °This information is not intended to replace advice given to you by your health care provider. Make sure you discuss any questions you have with your health care provider. °Document Revised: 03/13/2017 Document Reviewed: 08/14/2016 °Elsevier Patient Education © 2020 [employer] Inc[employer] ° °

## 2019-11-30 ENCOUNTER — Inpatient Hospital Stay (HOSPITAL_COMMUNITY)
Admission: AD | Admit: 2019-11-30 | Discharge: 2019-12-03 | DRG: 807 | Disposition: A | Discharge status: Home / Self Care | Payer: Medicaid Other | Attending: Obstetrics and Gynecology | Admitting: Obstetrics and Gynecology

## 2019-11-30 ENCOUNTER — Inpatient Hospital Stay (HOSPITAL_COMMUNITY): Payer: Medicaid Other | Admitting: Anesthesiology

## 2019-11-30 ENCOUNTER — Encounter (HOSPITAL_COMMUNITY): Payer: Self-pay | Admitting: Obstetrics and Gynecology

## 2019-11-30 ENCOUNTER — Other Ambulatory Visit: Payer: Self-pay

## 2019-11-30 DIAGNOSIS — Z23 Encounter for immunization: Secondary | ICD-10-CM

## 2019-11-30 DIAGNOSIS — R87612 Low grade squamous intraepithelial lesion on cytologic smear of cervix (LGSIL): Secondary | ICD-10-CM | POA: Diagnosis present

## 2019-11-30 DIAGNOSIS — Z2839 Other underimmunization status: Secondary | ICD-10-CM

## 2019-11-30 DIAGNOSIS — F419 Anxiety disorder, unspecified: Secondary | ICD-10-CM | POA: Diagnosis present

## 2019-11-30 DIAGNOSIS — O26893 Other specified pregnancy related conditions, third trimester: Secondary | ICD-10-CM | POA: Diagnosis present

## 2019-11-30 DIAGNOSIS — Z3A38 38 weeks gestation of pregnancy: Secondary | ICD-10-CM

## 2019-11-30 DIAGNOSIS — F32A Depression, unspecified: Secondary | ICD-10-CM | POA: Diagnosis present

## 2019-11-30 DIAGNOSIS — F329 Major depressive disorder, single episode, unspecified: Secondary | ICD-10-CM | POA: Diagnosis present

## 2019-11-30 DIAGNOSIS — O99324 Drug use complicating childbirth: Secondary | ICD-10-CM | POA: Diagnosis present

## 2019-11-30 DIAGNOSIS — F411 Generalized anxiety disorder: Secondary | ICD-10-CM | POA: Diagnosis present

## 2019-11-30 DIAGNOSIS — Z6791 Unspecified blood type, Rh negative: Secondary | ICD-10-CM | POA: Diagnosis not present

## 2019-11-30 DIAGNOSIS — Z20822 Contact with and (suspected) exposure to covid-19: Secondary | ICD-10-CM | POA: Diagnosis present

## 2019-11-30 DIAGNOSIS — F1290 Cannabis use, unspecified, uncomplicated: Secondary | ICD-10-CM | POA: Diagnosis present

## 2019-11-30 DIAGNOSIS — O09899 Supervision of other high risk pregnancies, unspecified trimester: Secondary | ICD-10-CM

## 2019-11-30 DIAGNOSIS — Z87891 Personal history of nicotine dependence: Secondary | ICD-10-CM

## 2019-11-30 DIAGNOSIS — Z283 Underimmunization status: Secondary | ICD-10-CM

## 2019-11-30 DIAGNOSIS — N889 Noninflammatory disorder of cervix uteri, unspecified: Secondary | ICD-10-CM | POA: Diagnosis present

## 2019-11-30 DIAGNOSIS — N879 Dysplasia of cervix uteri, unspecified: Secondary | ICD-10-CM | POA: Diagnosis present

## 2019-11-30 LAB — CBC
HCT: 34.7 % — ABNORMAL LOW (ref 36.0–46.0)
Hemoglobin: 11.3 g/dL — ABNORMAL LOW (ref 12.0–15.0)
MCH: 31.2 pg (ref 26.0–34.0)
MCHC: 32.6 g/dL (ref 30.0–36.0)
MCV: 95.9 fL (ref 80.0–100.0)
Platelets: 248 10*3/uL (ref 150–400)
RBC: 3.62 MIL/uL — ABNORMAL LOW (ref 3.87–5.11)
RDW: 12 % (ref 11.5–15.5)
WBC: 9.2 10*3/uL (ref 4.0–10.5)
nRBC: 0 % (ref 0.0–0.2)

## 2019-11-30 LAB — SARS CORONAVIRUS 2 BY RT PCR (HOSPITAL ORDER, PERFORMED IN ~~LOC~~ HOSPITAL LAB): SARS Coronavirus 2: NEGATIVE

## 2019-11-30 LAB — TYPE AND SCREEN
ABO/RH(D): A NEG
Antibody Screen: NEGATIVE

## 2019-11-30 LAB — POCT FERN TEST: POCT Fern Test: POSITIVE

## 2019-11-30 MED ORDER — LIDOCAINE HCL (PF) 1 % IJ SOLN: 30.0000 mL | INTRAMUSCULAR | Status: DC | PRN

## 2019-11-30 MED ORDER — SOD CITRATE-CITRIC ACID 500-334 MG/5ML PO SOLN: 30.0000 mL | ORAL | Status: DC | PRN

## 2019-11-30 MED ORDER — FENTANYL CITRATE (PF) 100 MCG/2ML IJ SOLN
100.0000 ug | INTRAMUSCULAR | Status: DC | PRN
  Administered 2019-11-30 (×2): 100 ug via INTRAVENOUS
  Filled 2019-11-30 (×2): qty 2

## 2019-11-30 MED ORDER — OXYTOCIN BOLUS FROM INFUSION: 333.0000 mL | Freq: Once | INTRAVENOUS | Status: DC

## 2019-11-30 MED ORDER — PHENYLEPHRINE 40 MCG/ML (10ML) SYRINGE FOR IV PUSH (FOR BLOOD PRESSURE SUPPORT): 80.0000 ug | PREFILLED_SYRINGE | INTRAVENOUS | Status: DC | PRN

## 2019-11-30 MED ORDER — FENTANYL-BUPIVACAINE-NACL 0.5-0.125-0.9 MG/250ML-% EP SOLN
12.0000 mL/h | EPIDURAL | Status: DC | PRN
  Filled 2019-11-30: qty 250

## 2019-11-30 MED ORDER — OXYTOCIN-SODIUM CHLORIDE 30-0.9 UT/500ML-% IV SOLN
1.0000 m[IU]/min | INTRAVENOUS | Status: DC
  Administered 2019-11-30: 2 m[IU]/min via INTRAVENOUS
  Filled 2019-11-30: qty 500

## 2019-11-30 MED ORDER — EPHEDRINE 5 MG/ML INJ: 10.0000 mg | INTRAVENOUS | Status: DC | PRN

## 2019-11-30 MED ORDER — SODIUM CHLORIDE (PF) 0.9 % IJ SOLN
INTRAMUSCULAR | Status: DC | PRN
  Administered 2019-11-30: 12 mL/h via EPIDURAL

## 2019-11-30 MED ORDER — OXYCODONE-ACETAMINOPHEN 5-325 MG PO TABS: 1.0000 | ORAL_TABLET | ORAL | Status: DC | PRN

## 2019-11-30 MED ORDER — ACETAMINOPHEN 325 MG PO TABS: 650.0000 mg | ORAL_TABLET | ORAL | Status: DC | PRN

## 2019-11-30 MED ORDER — LACTATED RINGERS IV SOLN: 500.0000 mL | INTRAVENOUS | Status: DC | PRN

## 2019-11-30 MED ORDER — ONDANSETRON HCL 4 MG/2ML IJ SOLN
4.0000 mg | Freq: Four times a day (QID) | INTRAMUSCULAR | Status: DC | PRN
  Administered 2019-11-30: 4 mg via INTRAVENOUS
  Filled 2019-11-30: qty 2

## 2019-11-30 MED ORDER — LACTATED RINGERS IV SOLN
500.0000 mL | Freq: Once | INTRAVENOUS | Status: AC
  Administered 2019-11-30: 500 mL via INTRAVENOUS

## 2019-11-30 MED ORDER — TERBUTALINE SULFATE 1 MG/ML IJ SOLN: 0.2500 mg | Freq: Once | INTRAMUSCULAR | Status: DC | PRN

## 2019-11-30 MED ORDER — FLEET ENEMA 7-19 GM/118ML RE ENEM: 1.0000 | ENEMA | RECTAL | Status: DC | PRN

## 2019-11-30 MED ORDER — LIDOCAINE HCL (PF) 1 % IJ SOLN
INTRAMUSCULAR | Status: DC | PRN
  Administered 2019-11-30: 3 mL via EPIDURAL
  Administered 2019-11-30: 7 mL via EPIDURAL

## 2019-11-30 MED ORDER — OXYCODONE-ACETAMINOPHEN 5-325 MG PO TABS: 2.0000 | ORAL_TABLET | ORAL | Status: DC | PRN

## 2019-11-30 MED ORDER — FENTANYL CITRATE (PF) 100 MCG/2ML IJ SOLN
INTRAMUSCULAR | Status: AC
  Filled 2019-11-30: qty 2

## 2019-11-30 MED ORDER — HYDROXYZINE HCL 10 MG PO TABS
10.0000 mg | ORAL_TABLET | ORAL | Status: DC | PRN
  Filled 2019-11-30: qty 1

## 2019-11-30 MED ORDER — LACTATED RINGERS IV SOLN: INTRAVENOUS | Status: DC

## 2019-11-30 MED ORDER — OXYTOCIN-SODIUM CHLORIDE 30-0.9 UT/500ML-% IV SOLN: 2.5000 [IU]/h | INTRAVENOUS | Status: DC

## 2019-11-30 MED ORDER — DIPHENHYDRAMINE HCL 50 MG/ML IJ SOLN: 12.5000 mg | INTRAMUSCULAR | Status: DC | PRN

## 2019-11-30 NOTE — MAU Note (Signed)
Was here 2 days w/ severe ctx's.  Was 3-4cm.  Mild yesterday. Has had some contractions, are now every 7 min apart.  SROM about 30 min ago, clear fluid, still coming.

## 2019-11-30 NOTE — H&P (Addendum)
OBSTETRIC ADMISSION HISTORY AND PHYSICAL  Yvonne Grant is a 34 y.o. female G1P0 with IUP at 44w0dpresenting for SROM at 1330. She reports +FMs. No LOF, VB, blurry vision, headaches, peripheral edema, or RUQ pain. She plans on breast feeding. She requests interval IUD (mirena) for birth control.  Dating: By LMP --->  Estimated Date of Delivery: 12/14/19  Sono:  _0 , normal anatomy, cephalic presentation, anterior placenta, 415g, 97%ile -limited heart and spine views  Prenatal History/Complications: Rubella nonimmune LSIL, +HPV, s/p Colposcopy Marijuana use prior to pregnancy Rh negative, Rhogam given on 09/26/2019  Past Medical History: Past Medical History:  Diagnosis Date  . Medical history non-contributory     Past Surgical History: Past Surgical History:  Procedure Laterality Date  . COLPOSCOPY  09/26/2019      . NO PAST SURGERIES      Obstetrical History: OB History    Gravida  1   Para      Term      Preterm      AB      Living        SAB      TAB      Ectopic      Multiple      Live Births              Social History: Social History   Socioeconomic History  . Marital status: Legally Separated    Spouse name: Not on file  . Number of children: Not on file  . Years of education: Not on file  . Highest education level: Not on file  Occupational History  . Not on file  Tobacco Use  . Smoking status: Former Smoker    Quit date: 06/10/2018    Years since quitting: 1.4  . Smokeless tobacco: Former UNetwork engineerand Sexual Activity  . Alcohol use: Not Currently  . Drug use: Yes    Types: Marijuana    Comment: last time she used was last week  . Sexual activity: Yes    Birth control/protection: None  Other Topics Concern  . Not on file  Social History Narrative  . Not on file   Social Determinants of Health   Financial Resource Strain:   . Difficulty of Paying Living Expenses:   Food Insecurity: No Food Insecurity  .  Worried About RCharity fundraiserin the Last Year: Never true  . Ran Out of Food in the Last Year: Never true  Transportation Needs: No Transportation Needs  . Lack of Transportation (Medical): No  . Lack of Transportation (Non-Medical): No  Physical Activity:   . Days of Exercise per Week:   . Minutes of Exercise per Session:   Stress:   . Feeling of Stress :   Social Connections:   . Frequency of Communication with Friends and Family:   . Frequency of Social Gatherings with Friends and Family:   . Attends Religious Services:   . Active Member of Clubs or Organizations:   . Attends CArchivistMeetings:   .Marland KitchenMarital Status:     Family History: Family History  Problem Relation Age of Onset  . Diabetes Maternal Grandmother     Allergies: No Known Allergies  No medications prior to admission.     Review of Systems:  All systems reviewed and negative except as stated in HPI  PE: Blood pressure 108/70, pulse 69, temperature 98.7 F (37.1 C), temperature source Oral, resp. rate 16, height _1  (  1.727 m), weight 86.8 kg, SpO2 100 %. General appearance: alert, cooperative and appears stated age Lungs: regular rate and effort Heart: regular rate  Abdomen: soft, non-tender Extremities: Homans sign is negative, no sign of DVT Presentation: cephalic EFM: 689 bpm, moderate variability, 15x15 accels,  no decels Toco: CTX q4mDilation: 3.5 Effacement (%): 70 Station: -1, -2 Exam by:: n druebbisch rn  Prenatal labs: ABO, Rh:  A neg Antibody:   neg Rubella:  non-immune RPR:   nonreactive HBsAg:   nonreactive HIV:   nonreactive GBS:   negative 1 hr GTT 79  Prenatal Transfer Tool  Maternal Diabetes: No Genetic Screening: Normal Maternal Ultrasounds/Referrals: Normal Fetal Ultrasounds or other Referrals:  None Maternal Substance Abuse:  No Significant Maternal Medications:  None Significant Maternal Lab Results: Group B Strep negative and Rh  negative  Results for orders placed or performed during the hospital encounter of 11/30/19 (from the past 24 hour(s))  POCT fern test   Collection Time: 11/30/19  2:52 PM  Result Value Ref Range   POCT Fern Test Positive = ruptured amniotic membanes     Patient Active Problem List   Diagnosis Date Noted  . Rubella non-immune status, antepartum 11/08/2019  . Marijuana use 11/08/2019  . Cervical lesion 09/26/2019  . Cervical dysplasia 09/23/2019  . ANXIETY 01/05/2009  . DEPRESSION 01/05/2009  . SINUSITIS 01/05/2009  . ALOPECIA 01/05/2009    Assessment: Yvonne STALLONEis a 34y.o. G1P0 at 363w0dere for SROM at 1330.  1. Labor: expectant management, consider augmentation as appropriate. 2. FWB: Cat I 3. Pain: per patient request 4. GBS: negative 5. Rubella nonimmune: MMR post partum 6. MOF: breast 7. MOC: interval IUD (Mirena at HD) 8. Circ: N/A   Plan: Admit to L&D, anticipate NSVD  PeMatilde HaymakerMD 11/30/2019, 3:00 PM   GME ATTESTATION:  I saw and evaluated the patient. I agree with the findings and the plan of care as documented in the resident's note.  HaMerilyn BabaDO OB Fellow, [place]/18/2021 5:59 PM

## 2019-11-30 NOTE — Anesthesia Preprocedure Evaluation (Signed)
Anesthesia Evaluation  Patient identified by MRN, date of birth, ID band Patient awake    Reviewed: Allergy & Precautions, H&P , NPO status , Patient's Chart, lab work & pertinent test results  History of Anesthesia Complications Negative for: history of anesthetic complications  Airway Mallampati: II  TM Distance: >3 FB Neck ROM: full    Dental no notable dental hx.    Pulmonary neg pulmonary ROS, former smoker,    Pulmonary exam normal        Cardiovascular negative cardio ROS Normal cardiovascular exam Rhythm:regular Rate:Normal     Neuro/Psych negative neurological ROS  negative psych ROS   GI/Hepatic negative GI ROS, (+)     substance abuse  marijuana use,   Endo/Other  negative endocrine ROS  Renal/GU negative Renal ROS  negative genitourinary   Musculoskeletal   Abdominal   Peds  Hematology negative hematology ROS (+)   Anesthesia Other Findings   Reproductive/Obstetrics (+) Pregnancy                             Anesthesia Physical Anesthesia Plan  ASA: II  Anesthesia Plan: Epidural   Post-op Pain Management:    Induction:   PONV Risk Score and Plan:   Airway Management Planned:   Additional Equipment:   Intra-op Plan:   Post-operative Plan:   Informed Consent: I have reviewed the patients History and Physical, chart, labs and discussed the procedure including the risks, benefits and alternatives for the proposed anesthesia with the patient or authorized representative who has indicated his/her understanding and acceptance.       Plan Discussed with:   Anesthesia Plan Comments:         Anesthesia Quick Evaluation

## 2019-11-30 NOTE — Progress Notes (Signed)
Labor Progress Note Yvonne Grant is a 34 y.o. G1P0 at [redacted]w[redacted]d presented for SROM @1330 . S: Doing well without complaints.  O:  BP 125/71   Pulse (!) 53   Temp 98 F (36.7 C) (Oral)   Resp 18   Ht 5\' 8"  (1.727 m)   Wt 86.8 kg   SpO2 100%   BMI 29.10 kg/m  EFM: 130bpm/mod variability/+accels/no decels Toco: q1-4 minutes  CVE: Dilation: 4 Effacement (%): 70 Cervical Position: Middle Station: -1 Presentation: Vertex Exam by:: , MD   A&P: 34 y.o. G1P0 [redacted]w[redacted]d presented for SROM @1330 . #Labor: No cervical change since last check, will place IUPC to ensure adequate contractions on pitocin. Continue to augment as appropriate. #Pain: CSE #FWB: cat 1 #GBS negative #Marijuana use: patient endorses marijuana use for anxiety. No UDS collected given transparency.  #Anxiety/depression: previously has been on medication, not currently. SW consult after delivery. #Cervical lesion: PP visit at [facility] rather than HD for colpo/biopsy.  [redacted]w[redacted]d, MD 10:01 PM

## 2019-11-30 NOTE — Anesthesia Procedure Notes (Signed)
Epidural Patient location during procedure: OB Start time: 11/30/2019 8:29 PM End time: 11/30/2019 8:37 PM  Staffing Anesthesiologist: Lucretia Kern, MD Performed: anesthesiologist   Preanesthetic Checklist Completed: patient identified, IV checked, risks and benefits discussed, monitors and equipment checked, pre-op evaluation and timeout performed  Epidural Patient position: sitting Prep: DuraPrep Patient monitoring: heart rate, continuous pulse ox and blood pressure Approach: midline Location: L3-L4 Injection technique: LOR air  Needle:  Needle type: Tuohy  Needle gauge: 17 G Needle length: 9 cm Needle insertion depth: 7 cm Catheter type: closed end flexible Catheter size: 19 Gauge Catheter at skin depth: 12 cm Test dose: negative  Assessment Events: blood not aspirated, injection not painful, no injection resistance, no paresthesia and negative IV test  Additional Notes Reason for block:procedure for pain

## 2019-12-01 ENCOUNTER — Encounter (HOSPITAL_COMMUNITY): Payer: Self-pay | Admitting: Obstetrics and Gynecology

## 2019-12-01 DIAGNOSIS — Z3A38 38 weeks gestation of pregnancy: Secondary | ICD-10-CM

## 2019-12-01 LAB — RPR: RPR Ser Ql: NONREACTIVE — AB

## 2019-12-01 MED ORDER — BENZOCAINE-MENTHOL 20-0.5 % EX AERO
1.0000 "application " | INHALATION_SPRAY | CUTANEOUS | Status: DC | PRN
  Administered 2019-12-01: 1 via TOPICAL
  Filled 2019-12-01: qty 56

## 2019-12-01 MED ORDER — ONDANSETRON HCL 4 MG/2ML IJ SOLN: 4.0000 mg | INTRAMUSCULAR | Status: DC | PRN

## 2019-12-01 MED ORDER — ACETAMINOPHEN 325 MG PO TABS
650.0000 mg | ORAL_TABLET | ORAL | Status: DC | PRN
  Administered 2019-12-01 – 2019-12-02 (×4): 650 mg via ORAL
  Filled 2019-12-01 (×4): qty 2

## 2019-12-01 MED ORDER — IBUPROFEN 600 MG PO TABS
600.0000 mg | ORAL_TABLET | Freq: Four times a day (QID) | ORAL | Status: DC
  Administered 2019-12-01 – 2019-12-03 (×10): 600 mg via ORAL
  Filled 2019-12-01 (×10): qty 1

## 2019-12-01 MED ORDER — DIBUCAINE (PERIANAL) 1 % EX OINT: 1.0000 "application " | TOPICAL_OINTMENT | CUTANEOUS | Status: DC | PRN

## 2019-12-01 MED ORDER — COCONUT OIL OIL: 1.0000 "application " | TOPICAL_OIL | Status: DC | PRN

## 2019-12-01 MED ORDER — MAGNESIUM HYDROXIDE 400 MG/5ML PO SUSP: 30.0000 mL | ORAL | Status: DC | PRN

## 2019-12-01 MED ORDER — MEASLES, MUMPS & RUBELLA VAC IJ SOLR
0.5000 mL | Freq: Once | INTRAMUSCULAR | Status: AC
  Administered 2019-12-03: 0.5 mL via SUBCUTANEOUS
  Filled 2019-12-01: qty 0.5

## 2019-12-01 MED ORDER — WITCH HAZEL-GLYCERIN EX PADS: 1.0000 "application " | MEDICATED_PAD | CUTANEOUS | Status: DC | PRN

## 2019-12-01 MED ORDER — DIPHENHYDRAMINE HCL 25 MG PO CAPS: 25.0000 mg | ORAL_CAPSULE | Freq: Four times a day (QID) | ORAL | Status: DC | PRN

## 2019-12-01 MED ORDER — SIMETHICONE 80 MG PO CHEW: 80.0000 mg | CHEWABLE_TABLET | ORAL | Status: DC | PRN

## 2019-12-01 MED ORDER — PRENATAL MULTIVITAMIN CH
1.0000 | ORAL_TABLET | Freq: Every day | ORAL | Status: DC
  Administered 2019-12-02 – 2019-12-03 (×2): 1 via ORAL
  Filled 2019-12-01 (×3): qty 1

## 2019-12-01 MED ORDER — SENNOSIDES-DOCUSATE SODIUM 8.6-50 MG PO TABS
2.0000 | ORAL_TABLET | ORAL | Status: DC
  Administered 2019-12-01 – 2019-12-03 (×2): 2 via ORAL
  Filled 2019-12-01 (×2): qty 2

## 2019-12-01 MED ORDER — ONDANSETRON HCL 4 MG PO TABS: 4.0000 mg | ORAL_TABLET | ORAL | Status: DC | PRN

## 2019-12-01 MED ORDER — TETANUS-DIPHTH-ACELL PERTUSSIS 5-2.5-18.5 LF-MCG/0.5 IM SUSP: 0.5000 mL | Freq: Once | INTRAMUSCULAR | Status: DC

## 2019-12-01 NOTE — Progress Notes (Signed)
Around 1030 this morning Yvonne Grant got into an arugment with The FOB. She began screaming and crying. The FOB was asked to leave the room by staff. The RN, management and Candelaria Stagers all talked with Ms. Gang to help calm he down. The baby was removed from the room per mother's request. After some time passed, the episode was resolved. FOB was allowed back to her room and baby returned to her room too.

## 2019-12-01 NOTE — Discharge Summary (Addendum)
Postpartum Discharge Summary     Patient Name: Yvonne Grant DOB: 1986/01/20 MRN: [id number]  Date of admission: 11/30/2019 Delivery date:12/01/2019  Delivering provider: Arrie Senate  Date of discharge: 12/03/2019  Admitting diagnosis: Normal labor [O80, Z37.9] Intrauterine pregnancy: [redacted]w[redacted]d    Secondary diagnosis:  Active Problems:   Anxiety state   Depression   Cervical dysplasia   Cervical lesion   Rubella non-immune status, antepartum   Marijuana use   Normal labor   Vaginal delivery  Additional problems: None    Discharge diagnosis: Term Pregnancy Delivered                                              Post partum procedures:None Augmentation: Pitocin Complications: None  Hospital course: Onset of Labor With Vaginal Delivery      34y.o. yo G1P0 at 356w1das admitted in Latent Labor on 11/30/2019. Patient presents with SROM and progressed, augmentation with pitocin. Patient had an uncomplicated labor course as follows:  Membrane Rupture Time/Date: 1:20 PM ,11/30/2019   Delivery Method:Vaginal, Spontaneous  Episiotomy: None  Lacerations:  Sulcus;Labial  Patient had an uncomplicated postpartum course.  She is ambulating, tolerating a regular diet, passing flatus, and urinating well. Patient is discharged home in stable condition on 12/03/19.  Newborn Data: Birth date:12/01/2019  Birth time:2:26 AM  Gender:Female  Living status:Living  Apgars:8 ,9  Weight:2710 g   Magnesium Sulfate received: No BMZ received: No Rhophylac:No MMR:Yes, to receive prior to discharge T-DaP:Given postpartum Flu: N/A Transfusion:No  Physical exam  Vitals:   12/02/19 0544 12/02/19 1501 12/02/19 2010 12/03/19 0625  BP: 125/74 128/74 130/84 (!) 119/59  Pulse: 62 (!) 57 60 (!) 59  Resp: _0 Temp: 98.2 F (36.8 C) 98.2 F (36.8 C) 98.3 F (36.8 C) 98.6 F (37 C)  TempSrc: Axillary Oral Oral Oral  SpO2:   99% 98%  Weight:      Height:       General: alert,  cooperative and no distress Lochia: appropriate Uterine Fundus: firm Incision: N/A DVT Evaluation: No evidence of DVT seen on physical exam. Labs: Lab Results  Component Value Date   WBC 9.2 11/30/2019   HGB 11.3 (L) 11/30/2019   HCT 34.7 (L) 11/30/2019   MCV 95.9 11/30/2019   PLT 248 11/30/2019   CMP Latest Ref Rng & Units 03/13/2019  Glucose 70 - 99 mg/dL 85  BUN 6 - 20 mg/dL 6  Creatinine 0.44 - 1.00 mg/dL 0.59  Sodium 135 - 145 mmol/L 145  Potassium 3.5 - 5.1 mmol/L 4.1  Chloride 98 - 111 mmol/L 107  CO2 22 - 32 mmol/L 24  Calcium 8.9 - 10.3 mg/dL 9.2  Total Protein 6.0 - 8.3 g/dL -  Total Bilirubin 0.3 - 1.2 mg/dL -  Alkaline Phos 39 - 117 U/L -  AST 0 - 37 U/L -  ALT 0 - 35 U/L -   Edinburgh Score: Edinburgh Postnatal Depression Scale Screening Tool 12/01/2019  I have been able to laugh and see the funny side of things. 1  I have looked forward with enjoyment to things. 1  I have blamed myself unnecessarily when things went wrong. 3  I have been anxious or worried for no good reason. 3  I have felt scared or panicky for no good reason. 1  Things  have been getting on top of me. 1  I have been so unhappy that I have had difficulty sleeping. 1  I have felt sad or miserable. 1  I have been so unhappy that I have been crying. 1  The thought of harming myself has occurred to me. 0  Edinburgh Postnatal Depression Scale Total 13     After visit meds:  Allergies as of 12/03/2019   No Known Allergies     Medication List    TAKE these medications   acetaminophen 325 MG tablet Commonly known as: Tylenol Take 2 tablets (650 mg total) by mouth every 4 (four) hours as needed (for pain scale < 4).   coconut oil Oil Apply 1 application topically as needed.   ibuprofen 600 MG tablet Commonly known as: ADVIL Take 1 tablet (600 mg total) by mouth every 6 (six) hours.   WesTab Plus 27-1 MG Tabs Take 1 tablet by mouth daily.       Discharge home in stable  condition.  Infant Feeding: Bottle Infant Disposition:home with mother Discharge instruction: per After Visit Summary and Postpartum booklet. Activity: Advance as tolerated. Pelvic rest for 6 weeks.  Diet: routine diet  Marijuana use, Anxiety, Depression:Cleared for discharge by SW. States mood is good today but had elevated [place] to 13. Not currently on medication for anxiety/depression but has been previously. Postpartum mood check. Cervical lesion: PP visit at [facility] rather than HD for colpo/biopsy. Future Appointments: Future Appointments  Date Time Provider Department Center  12/30/2019  8:55 AM Sloan Leiter, MD [facility]    Follow up Visit:  Please schedule this patient for a In person postpartum visit in 4 weeks with the following provider: MD. Additional Postpartum F/U: Postpartum depression checkup and will need colpo/biopsy at postpartum visit for cervical lesion  Low risk pregnancy complicated by: none Delivery mode:  Vaginal, Spontaneous  Anticipated Birth Control:  IUD   12/03/2019 Maureen Ralphs, MD   Attestation of Supervision of Student:  I confirm that I have verified the information documented in the resident student's note and that I have also personally reperformed the history, physical exam and all medical decision making activities.  I have verified that all services and findings are accurately documented in this student's note; and I agree with management and plan as outlined in the documentation. I have also made any necessary editorial changes.  Wende Mott, Becker for [employer], [employer] 12/03/2019 10:29 AM

## 2019-12-01 NOTE — Progress Notes (Signed)
I offered listening presence when pt was tearful following a conversation with her SO.  When he returned to the room to talk further, she asked for privacy to speak with him.  If further needs arise, please page [phone].  Chaplain Dyanne Carrel, Bcc Pager, [phone] 3:06 PM

## 2019-12-01 NOTE — Lactation Note (Signed)
This note was copied from a baby's chart. Lactation Consultation Note  Patient Name: Yvonne Grant OIZTI'W Date: 12/01/2019 Reason for consult: Initial assessment;Primapara;Early term 37-38.6wks;Infant < 6lbs  Mom is a G1P1.  Baby Yvonne Element now 38 hours old.  Mom with hx of anxiety and depression and hx/current THC use.  Infant in crib on arrival cuing.  Mom reports they just tried to feed her.  Mom reports she will suck and fall asleep.   Asked mom if we could try some hand expression and spoon feeding.  Mom in agreement.  Mom reprots she used to weigh over 300 pounds.  Mom has pendulous breasts.  Assist with some hand expression and spoon feeding.  Able to express small drops from spoon and feed to Element.  Asked mom if we could try and latch her.  Mom in agreement.  Assisted with latch in cross cradle hold.  Infant latched well with some rythmic sucking and intermittent swallows.  Off and on for about 30 minutes.  She let go.  Mom reports she would not take the other breast.  LC was gone to get DEBP when this happened.  Mom reports she would like to take a nap and start pumping after the next feed.  LC showed mom how the pump worked and how to take parts apart and wash them and let them air dry. Mom does not have a pump for home use.  LC discussed with mom options for pump once she goes home. Did not discuss THC use with mom at this time. Father of baby's mom in room with them at this time.  Urged to feed on cue at least 8-12 times day.  Urged to massage and hand express and use DEBP past the every 3 hour breastfeed. Mom has [facility] Breastfeeding resource handout.  Urged mom to call lactation as needed.,       Neomia Dear 12/01/2019, 5:30 PM

## 2019-12-01 NOTE — Progress Notes (Signed)
Labor Progress Note Yvonne Grant is a 34 y.o. G1P0 at [redacted]w[redacted]d who presented for SROM at 1330.  S: Doing well, no complaints.  O:  BP 119/65   Pulse (!) 47   Temp 98.2 F (36.8 C) (Oral)   Resp 18   Ht 5\' 8"  (1.727 m)   Wt 86.8 kg   SpO2 100%   BMI 29.10 kg/m  EFM: 125/mod variability/+ accels/occ early decels  CVE: Dilation: 4 Effacement (%): 70 Cervical Position: Middle Station: -1 Presentation: Vertex Exam by:: [email or web address], MD   A&P: 34 y.o. G1P0 [redacted]w[redacted]d presenting for SROM at 1330. #Labor: IUPC in place, contractions adequate. Due for next cervical check at 0130. Pitocin at 6 mL/hr. #Pain: Epidural #FWB: Cat I #GBS negative #Marijuana use: Patient endorses marijuana use for anxiety. No UDS collected given transparency.  #Anxiety/depression: Previously has been on medication, not currently. SW consult after delivery. #Cervical lesion: PP visit at [facility] rather than HD for colpo/biopsy.  [facility], MD 12:34 AM

## 2019-12-01 NOTE — Clinical Social Work Maternal (Signed)
CLINICAL SOCIAL WORK MATERNAL/CHILD NOTE  Patient Details  Name: Yvonne Grant MRN: [id number] Date of Birth: 05/15/1985  Date:  12/01/2019  Clinical Social Worker Initiating Note:  Dixie Coppa,LCSW Date/Time: Initiated:  12/01/19/0145     Child's Name:  Yvonne Grant   Biological Parents:  Mother, Father (Yvonne Grant, Yvonne Grant)   Need for Interpreter:  None   Reason for Referral:  Behavioral Health Concerns, Current Substance Use/Substance Use During Pregnancy    Address:  [address] [place] [address]    Phone number:  [phone] (home)     Additional phone number: none   Household Members/Support Persons (HM/SP):   Household Member/Support Person 1   HM/SP Name Relationship DOB or Age  HM/SP -1  Yvonne Grant   MOB   12/26/1986  HM/SP -2  Yvonne Grant   FOB     HM/SP -3        HM/SP -4        HM/SP -5        HM/SP -6        HM/SP -7        HM/SP -8          Natural Supports (not living in the home):  Parent   Professional Supports: None   Employment: Full-time   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   Education:  Some College   Homebound arranged:  n/a  Financial Resources:  Medicaid   Other Resources:  Food Stamps , WIC (plans to apply for Food Stamps.)   Cultural/Religious Considerations Which May Impact Care:  none   Strengths:  Ability to meet basic needs , Compliance with medical plan , Home prepared for child , Pediatrician chosen   Psychotropic Medications:    none at this time.      Pediatrician:     not yet chosen   Pediatrician List:   [place]    [place]    [place]    [place]    [place]    [place]      Pediatrician Fax Number:    Risk Factors/Current Problems:  None, Substance Use    Cognitive State:  Alert , Insightful , Able to Concentrate    Mood/Affect:  Relaxed , Comfortable , Calm , Interested , Happy    CSW Assessment: CSW  consulted as MOB has a hx of depression/anxiety, THC use in pregnancy as well as scored 13 on Edinburgh. CSW went to speak with MOB at bedside to address further needs.  CSW congratulated MOB on the birth of infant. CSW advised MOB of CSW's role and the HIPPA policy. CSW was agreeable to having guest in the room leave as CSW spoke with MOB. CSW advised MOB of the reason for CSW coming to visit with her. MOB reported that she used THC as a way to help cope with her anxiety and depression. MOB reports that she suffered from really severe anxiety and using THC has been helpful for her. CSW understanding and advised MOB of the hospital drug screen polciy. CSW observed that MOB became tearful as CSW explained the policy to MOB, therefore CSW paused to offer support. MOB reported that she was never told this at the [facility] and was advised by their staff there that "they will not do anything but come and talk with you. They wont take the baby". CSW advised MOB that CSW here at hospital works for hospital and not CPS. CSW did   advised MOB that if infants CDS or UDS is positive then CSW would be obligated to make CPS report. MOB reported that she understood and expressed a desire for [facility] to have told her the truth about using THC while pregnant MOB continuously reported to this CSW no desire to keep using THC and expressed that she is already working with staff from the [facility] to get medication for her anxiety. MOB expressed a previous hx of medication use however reported "it made me feel like a zombie". MOB expressed to this CSW that she is wanting to try medications again so that she can stop THC use.  CSW inquired from MOB on when her last use for THC was and MOB reported "yesteryday" (11/30/19). MOB reports that she was feeling very anxious and used to help with her feelings. MOB reported that she diagnosed with anxiety and depression around the age of 14. MOB expressed taking no current meds or being  involved in therapy. MOB was offered therapy resources in which MOB declined at this time. In spekaing with MOB around substance use and mental health , MOB appeared to be regretful for her THC as MOB reports that she doesn't do it often and is interested in other ways to cope with anxiety.   MOB reported that she gets WIC and expressed a desire to apply for food stamps. MOB identified her spouse and his family as her primary supports during this time.   CSW took time to provide MOB with PPD and SIDS education. MOB was given PPD Checklist for her to keep track of her feelings as they relate to PPD. MOB thanked CSW and reported that she and FOB have all needed items to care for infant with no other needs or concerns.   CSW will continue to monitor infants CDS and UDS and make CPS report if warranted. At this time, there are no barriers to infant d/c home with MOB.   CSW Plan/Description:  No Further Intervention Required/No Barriers to Discharge, Sudden Infant Death Syndrome (SIDS) Education, Perinatal Mood and Anxiety Disorder (PMADs) Education, CSW Will Continue to Monitor Umbilical Cord Tissue Drug Screen Results and Make Report if Warranted, Hospital Drug Screen Policy Information    Arush Gatliff S Carely Nappier, LCSWA 12/01/2019, 2:13 PM 

## 2019-12-01 NOTE — Discharge Instructions (Signed)

## 2019-12-01 NOTE — Anesthesia Postprocedure Evaluation (Signed)
Anesthesia Post Note  Patient: Yvonne Grant  Procedure(s) Performed: AN AD HOC LABOR EPIDURAL     Patient location during evaluation: Mother Baby Anesthesia Type: Epidural Level of consciousness: awake Pain management: satisfactory to patient Vital Signs Assessment: post-procedure vital signs reviewed and stable Respiratory status: spontaneous breathing Cardiovascular status: stable Anesthetic complications: no   No complications documented.  Last Vitals:  Vitals:   12/01/19 0418 12/01/19 0604  BP: 117/64 105/64  Pulse: (!) 47 (!) 57  Resp: 18 16  Temp:  36.7 C  SpO2:      Last Pain:  Vitals:   12/01/19 1223  TempSrc:   PainSc: 0-No pain   Pain Goal:                   [employer]

## 2019-12-02 NOTE — Progress Notes (Addendum)
2:31pm-CSW spoke with CPS worker, Holland Falling and was advised that there are no barriers to infant d/c home with MOB at this time.   CSW received call from [facility] CPS worker and was advised that she would be up to hospital to see MOB around 2pm today. CSW to update MOB at bedside of this.   Claude Manges Davinity Fanara, MSW, LCSW [facility] [place] [phone]

## 2019-12-02 NOTE — Lactation Note (Signed)
This note was copied from a baby's chart. Lactation Consultation Note  Patient Name: Yvonne Grant [id number] Date: 12/02/2019 Reason for consult: Early term 37-38.6wks;Follow-up assessment;Mother's request;Infant < 6lbs;Primapara;Infant weight loss;1st time breastfeeding   Infant is 38 hours old 38 weeks, less than 6 lbs with 6% weight loss.  LC entered the room while Mom was giving 10 ml of DBM in a slow flow nipple. As per Mom, last breastfeeding was at 15:30 for approx. 20 minutes. Infant had frequent stools/urine output. Mom has been taught hand expression and placing the infant skin to skin. She pumped using the DEBP 4 times today as per Mom. She plans to pump as advised by RN.   Provided Mom education on the importance of skin to skin, expressing breast milk and offering the breast. Reviewed with Mom to increase supplementation volumes to 12-15 ml since infant is 54 hours old. Reviewed feeding cues and milk storage. Mom was given the breastfeeeding supplementation sheet and reviewed according to infant's age/hours of life concerning volume.   Plan 1. Mom to feed infant based on cues 8-12 in 24 hour period and will wake infant for feedings if time lapses is greater than 4 hours in between feedings.  2. Mom to pump after nursing and offer any drops of colostrum to the infant. Once volume of breast milk pumped increased, she will offer her expressed breast milk first and then donor breast milk.      Maternal Data Has patient been taught Hand Expression?: Yes Does the patient have breastfeeding experience prior to this delivery?: No  Feeding Feeding Type: Breast Milk with Donor Milk Nipple Type: Slow - flow  LATCH Score Latch: Grasps breast easily, tongue down, lips flanged, rhythmical sucking.  Audible Swallowing: Spontaneous and intermittent  Type of Nipple: Everted at rest and after stimulation  Comfort (Breast/Nipple): Soft / non-tender  Hold (Positioning): Assistance  needed to correctly position infant at breast and maintain latch.  LATCH Score: 9  Interventions Interventions: Breast feeding basics reviewed;Skin to skin;Breast massage;Hand express;Breast compression;Expressed milk;DEBP  Lactation Tools Discussed/Used Tools: Bottle;Pump Breast pump type: Double-Electric Breast Pump Pump Review: Milk Storage   Consult Status Consult Status: Follow-up Date: 12/03/19 Follow-up type: In-patient    Marcel Gary  Nicholson-Springer 12/02/2019, 4:56 PM

## 2019-12-02 NOTE — Progress Notes (Signed)
CSW made [facility] CPS report for infants UDS being positive for THC.     Claude Manges Jachai Okazaki, MSW, LCSW [facility] [place] [phone]

## 2019-12-02 NOTE — Progress Notes (Signed)
I followed up with Morrie Sheldon and her SO.  They reported that they were doing well today and were delighting in their baby.  I offered my congratulations and gave them some privacy to bond with their baby.  [address] Dyanne Carrel, Bcc Pager, [phone] 4:24 PM    12/02/19 1600  Clinical Encounter Type  Visited With Patient;Family  Visit Type Follow-up

## 2019-12-02 NOTE — Progress Notes (Signed)
POSTPARTUM PROGRESS NOTE  Subjective: Yvonne Grant is a 34 y.o. G1P1001 PPD#1 s/p SVD at [redacted]w[redacted]d.  She reports she doing well. No acute events overnight. She denies any problems with ambulating or voiding. Having limited PO intake secondary to nausea without vomiting. She has passed flatus. Pain is well controlled.  Lochia is less than menses.  Objective: Blood pressure 125/74, pulse 62, temperature 98.2 F (36.8 C), temperature source Axillary, resp. rate 20, height 5\' 8"  (1.727 m), weight 86.8 kg, SpO2 98 %, unknown if currently breastfeeding.  Physical Exam:  General: alert, cooperative and no distress Chest: no respiratory distress Abdomen: soft, non-tender  Uterine Fundus: firm, appropriately tender Extremities: No calf swelling or tenderness  no edema  Recent Labs    11/30/19 1523  HGB 11.3*  HCT 34.7*    Assessment/Plan: Yvonne Grant is a 34 y.o. G1P1001 PPD#1 s/p SVD at [redacted]w[redacted]d following SROM at home.  Routine Postpartum Care: Doing well, pain well-controlled. Multiple questions regarding care and feeding for baby. -- Continue routine care, lactation support  -- Contraception: IUD at [facility] -- Feeding: Breast Marijuana use, Anxiety, Depression: Pending social work consult. States mood is good today and denies depression symptoms. Not currently on medication for anxiety/depression but has been previously. Cervical lesion: PP visit at [facility] rather than HD for colpo/biopsy.  Dispo: Plan for discharge tomorrow pending normal postpartum course.  [facility], MD PGY-3 Family Medicine Resident

## 2019-12-03 MED ORDER — RHO D IMMUNE GLOBULIN 1500 UNIT/2ML IJ SOSY
300.0000 ug | PREFILLED_SYRINGE | Freq: Once | INTRAMUSCULAR | Status: DC
  Filled 2019-12-03: qty 2

## 2019-12-03 MED ORDER — COCONUT OIL OIL: 1.0000 "application " | TOPICAL_OIL | 0 refills | Status: DC | PRN

## 2019-12-03 MED ORDER — IBUPROFEN 600 MG PO TABS
600.0000 mg | ORAL_TABLET | Freq: Four times a day (QID) | ORAL | 0 refills | Status: DC
Start: 2019-12-03 — End: 2020-01-04

## 2019-12-03 MED ORDER — ACETAMINOPHEN 325 MG PO TABS
650.0000 mg | ORAL_TABLET | ORAL | Status: DC | PRN
Start: 2019-12-03 — End: 2020-01-04

## 2019-12-03 NOTE — Lactation Note (Signed)
This note was copied from a baby's chart. Lactation Consultation Note  Patient Name: Yvonne Grant Date: 12/03/2019 Reason for consult: Follow-up assessment;Primapara;1st time breastfeeding;Early term 37-38.6wks;Infant < 6lbs;Infant weight loss;Other (Comment) (10 % weight loss)  Baby is 56 hours old  Baby awake , LC checked the diaper and changed a med - large wet diaper.  LC placed baby STS and 1st mom requested to pre-pump to increase flow.  Several drops noted and mom latched the baby shallow. Mom able to latch the baby easily. LC checked the lip lines and flipped the upper lip and eased down on the chin. LC pointed out swallows and the deep latch. Per mom comfortable.  LC had mom release at 11 mins due to the baby being non - nutritive and supplement with 22 cal Neosure.  LC plan reviewed with mom for baby 10 % weight loss, less than 6 pounds,  Feed with feeding cues and by 3 hours .  If baby is wide awake - after breast massage, hand express, prepump  Latch - feed 15 -20 mins and then supplement with 30 ml.  If the baby is sluggish try and appetizer pf EBM or formula 10 ml and then latch , if still sluggish feed the entire supplement and if still hungry breast feed.  Post pump both breast for 15 - 20 mins / save milk for the next feeding.  Next feeding switch to the other breast and follow the above instructions.   DEBP has already been set up and mom aware of how to use it.  LC mentioned to make it easier prior to latching use hand pump and DEBP for post pumping.   Mom aware the baby may have to stay due to the weight loss.  RN caring for the baby is aware of the [facility] plan.   Maternal Data Has patient been taught Hand Expression?: Yes  Feeding Feeding Type: Breast Milk with Formula added Nipple Type: Extra Slow Flow  LATCH Score Latch: Grasps breast easily, tongue down, lips flanged, rhythmical sucking.  Audible Swallowing: A few with stimulation  Type of  Nipple: Everted at rest and after stimulation  Comfort (Breast/Nipple): Soft / non-tender  Hold (Positioning): Assistance needed to correctly position infant at breast and maintain latch.  LATCH Score: 8  Interventions Interventions: Breast feeding basics reviewed;Assisted with latch;Skin to skin;Breast massage;Pre-pump if needed;Breast compression;Adjust position;Support pillows;Position options;Hand pump;DEBP  Lactation Tools Discussed/Used Tools: Pump Breast pump type: Double-Electric Breast Pump   Consult Status Consult Status: Follow-up Date: 12/04/19 Follow-up type: In-patient    Matilde Sprang Emeric Novinger 12/03/2019, 10:57 AM

## 2019-12-04 ENCOUNTER — Ambulatory Visit: Payer: Self-pay

## 2019-12-04 NOTE — Lactation Note (Addendum)
This note was copied from a baby's chart. Lactation Consultation Note  Patient Name: Yvonne Grant [id number] Date: 12/04/2019   Infant has gained 35 g over the last 24 hrs. Mom just pumped 10 mL. Mom reports that infant latches well at the breast, but soon falls asleep. Although infant has gained weight, infant's intake with bottles may not be adequate for continued weight gain; extra-slow flow nipple is being used.   Mom reports bottle feeding infant in a cradle position. Mom to call for me when infant is ready to feed again, so I can show her how to feed infant in an upright or inclined side-lying position.   Mom observed pumping briefly. The size 27 flanges may be slightly too big for Mom, while Mom reports that the size 24 flanges pinched her (and her nipple diameter suggests that she needs larger than size 24 at rest suggest). Nipples are atraumatic & Mom says she she does not hurt with the size 27 flanges.   Mom used to weigh 300 lbs. She lost weight a few years ago through working out & counting calories. She was not overweight when going through puberty. Mom's breasts appear flaccid; breasts palpate as some glandular changes are taking place. Mom reports having felt little "pebbles" in her breasts prior to pumping.   Mom declined a [facility] loaner & is planning on buying an Evenflo pump (which includes a variety of flange sizes).   Mom knows how to call for [place] when ready for [place] to return.   Lurline Hare [facility] 12/04/2019, 8:19 AM

## 2019-12-04 NOTE — Lactation Note (Signed)
This note was copied from a baby's chart. Lactation Consultation Note  Patient Name: Girl Jina Olenick [id number] Date: 12/04/2019  Bottle feeding was attempted in side-lying inclined. Infant was not feeding well with extra-slow flow nipple (feeding too slowly), then the Similac yellow slow flow nipple was attempted (too fast). Extra-slow flow nipple was reattempted & it seemed apparent that infant must have been limiting her volumes with the extra-slow flow nipplew when I first tried it (formula was noted coming out of the sides of infant's mouth). Mom concurred that she had also repeatedly noted that EBM/formula would come out of the sides of the infant's mouth when using the extra-slow flow nipple.   The Nfant slow flow nipple was used with good results. I provided an extra Nfant slow flow nipple. Parents may consider purchasing Dr. Theora Gianotti Preemie nipple, which is comparable. Parents are aware that an SLP may contact them post-discharge.   Lurline Hare [facility] 12/04/2019, 10:20 AM

## 2019-12-16 ENCOUNTER — Other Ambulatory Visit: Payer: Self-pay

## 2019-12-16 ENCOUNTER — Encounter: Payer: Self-pay | Admitting: General Practice

## 2019-12-16 ENCOUNTER — Ambulatory Visit: Payer: Medicaid Other | Admitting: Clinical

## 2019-12-16 DIAGNOSIS — Z91199 Patient's noncompliance with other medical treatment and regimen due to unspecified reason: Secondary | ICD-10-CM

## 2019-12-16 NOTE — BH Specialist Note (Signed)
Pt did not arrive to video visit and did not answer the phone ; Left HIPPA-compliant message to call back Asher Muir from Center for [employer] at [facility] at [phone] (main office) or [phone] (Adisynn Suleiman's office).  ; Pt does not have MyChart set up to leave message.

## 2019-12-30 ENCOUNTER — Ambulatory Visit: Payer: Medicaid Other | Admitting: Obstetrics and Gynecology

## 2020-01-04 ENCOUNTER — Encounter: Payer: Self-pay | Admitting: Obstetrics & Gynecology

## 2020-01-04 ENCOUNTER — Ambulatory Visit (INDEPENDENT_AMBULATORY_CARE_PROVIDER_SITE_OTHER): Payer: Medicaid Other | Admitting: Obstetrics & Gynecology

## 2020-01-04 ENCOUNTER — Other Ambulatory Visit: Payer: Self-pay

## 2020-01-04 DIAGNOSIS — R87612 Low grade squamous intraepithelial lesion on cytologic smear of cervix (LGSIL): Secondary | ICD-10-CM | POA: Diagnosis not present

## 2020-01-04 DIAGNOSIS — Z8759 Personal history of other complications of pregnancy, childbirth and the puerperium: Secondary | ICD-10-CM

## 2020-01-04 NOTE — Patient Instructions (Signed)
Etonogestrel implant What is this medicine? ETONOGESTREL (et oh noe JES trel) is a contraceptive (birth control) device. It is used to prevent pregnancy. It can be used for up to 3 years. This medicine may be used for other purposes; ask your health care provider or pharmacist if you have questions. COMMON BRAND NAME(S): Implanon, Nexplanon What should I tell my health care provider before I take this medicine? They need to know if you have any of these conditions:  abnormal vaginal bleeding  blood vessel disease or blood clots  breast, cervical, endometrial, ovarian, liver, or uterine cancer  diabetes  gallbladder disease  heart disease or recent heart attack  high blood pressure  high cholesterol or triglycerides  kidney disease  liver disease  migraine headaches  seizures  stroke  tobacco smoker  an unusual or allergic reaction to etonogestrel, anesthetics or antiseptics, other medicines, foods, dyes, or preservatives  pregnant or trying to get pregnant  breast-feeding How should I use this medicine? This device is inserted just under the skin on the inner side of your upper arm by a health care professional. Talk to your pediatrician regarding the use of this medicine in children. Special care may be needed. Overdosage: If you think you have taken too much of this medicine contact a poison control center or emergency room at once. NOTE: This medicine is only for you. Do not share this medicine with others. What if I miss a dose? This does not apply. What may interact with this medicine? Do not take this medicine with any of the following medications:  amprenavir  fosamprenavir This medicine may also interact with the following medications:  acitretin  aprepitant  armodafinil  bexarotene  bosentan  carbamazepine  certain medicines for fungal infections like fluconazole, ketoconazole, itraconazole and voriconazole  certain medicines to treat  hepatitis, HIV or AIDS  cyclosporine  felbamate  griseofulvin  lamotrigine  modafinil  oxcarbazepine  phenobarbital  phenytoin  primidone  rifabutin  rifampin  rifapentine  St. John's wort  topiramate This list may not describe all possible interactions. Give your health care provider a list of all the medicines, herbs, non-prescription drugs, or dietary supplements you use. Also tell them if you smoke, drink alcohol, or use illegal drugs. Some items may interact with your medicine. What should I watch for while using this medicine? This product does not protect you against HIV infection (AIDS) or other sexually transmitted diseases. You should be able to feel the implant by pressing your fingertips over the skin where it was inserted. Contact your doctor if you cannot feel the implant, and use a non-hormonal birth control method (such as condoms) until your doctor confirms that the implant is in place. Contact your doctor if you think that the implant may have broken or become bent while in your arm. You will receive a user card from your health care provider after the implant is inserted. The card is a record of the location of the implant in your upper arm and when it should be removed. Keep this card with your health records. What side effects may I notice from receiving this medicine? Side effects that you should report to your doctor or health care professional as soon as possible:  allergic reactions like skin rash, itching or hives, swelling of the face, lips, or tongue  breast lumps, breast tissue changes, or discharge  breathing problems  changes in emotions or moods  coughing up blood  if you feel that the implant   may have broken or bent while in your arm  high blood pressure  pain, irritation, swelling, or bruising at the insertion site  scar at site of insertion  signs of infection at the insertion site such as fever, and skin redness, pain or  discharge  signs and symptoms of a blood clot such as breathing problems; changes in vision; chest pain; severe, sudden headache; pain, swelling, warmth in the leg; trouble speaking; sudden numbness or weakness of the face, arm or leg  signs and symptoms of liver injury like dark yellow or brown urine; general ill feeling or flu-like symptoms; light-colored stools; loss of appetite; nausea; right upper belly pain; unusually weak or tired; yellowing of the eyes or skin  unusual vaginal bleeding, discharge Side effects that usually do not require medical attention (report to your doctor or health care professional if they continue or are bothersome):  acne  breast pain or tenderness  headache  irregular menstrual bleeding  nausea This list may not describe all possible side effects. Call your doctor for medical advice about side effects. You may report side effects to FDA at 1-800-FDA-1088. Where should I keep my medicine? This drug is given in a hospital or clinic and will not be stored at home. NOTE: This sheet is a summary. It may not cover all possible information. If you have questions about this medicine, talk to your doctor, pharmacist, or health care provider.  2020 Elsevier/Gold Standard (2019-01-11 11:33:04)  

## 2020-01-04 NOTE — Progress Notes (Signed)
    Post Partum Visit Note  Yvonne Grant is a 34 y.o. G57P1001 female who presents for a postpartum visit. She is 4 weeks postpartum following a normal spontaneous vaginal delivery.  I have fully reviewed the prenatal and intrapartum course. The delivery was at 38 gestational weeks.  Anesthesia: epidural. Postpartum course has been normal. Baby is doing well. Baby is feeding by bottle - Gerber Gentle. Bleeding no bleeding. Bowel function is normal. Bladder function is normal. Patient is sexually active. Contraception method is coitus interruptus. Postpartum depression screening: negative.   The pregnancy intention screening data noted above was reviewed. Potential methods of contraception were discussed. The patient elected to proceed with Hormonal Implant.      The following portions of the patient's history were reviewed and updated as appropriate: allergies, current medications, past family history, past medical history, past social history, past surgical history and problem list.  Review of Systems Pertinent items are noted in HPI.    Objective:  unknown if currently breastfeeding.  General:  alert, cooperative and no distress           Abdomen: not distended   Vulva:  not evaluated  Vagina: not evaluated                    Assessment:    normal postpartum exam. Pap smear not done at today's visit.   Plan:   Essential components of care per ACOG recommendations:  1.  Mood and well being: Patient with negative depression screening today. Reviewed local resources for support.  - Patient does use tobacco 2. Infant care and feeding:  -Patient currently breastmilk feeding? No  -Social determinants of health (SDOH) reviewed in EPIC. No concerns 3. Sexuality, contraception and birth spacing - Patient does not want a pregnancy in the next year.  Desired family size is 3 children.  - Reviewed forms of contraception in tiered fashion. Patient desired Nexplanon today.   -  Discussed birth spacing of 18 months  4. Sleep and fatigue -Encouraged family/partner/community support of 4 hrs of uninterrupted sleep to help with mood and fatigue  5. Physical Recovery  - Discussed patients delivery  - Patient had a 1 degree laceration, perineal healing reviewed. Patient expressed understanding - Patient has urinary incontinence? No  - Patient is safe to resume physical and sexual activity Abstain until she returns for Nexplanon 6.  Health Maintenance - Last pap smear done 07/2019 and was abnormal with LSIL Needs repeat colposcopy and will schedule per her request  Adam Phenix, MD Center for [facility], [facility]

## 2020-01-04 NOTE — BH Specialist Note (Signed)
Pt did not arrive to video visit and did not answer the phone ; Left HIPPA-compliant message to call back Salisha Bardsley from [facility] at [facility] at [phone] (main office) or [phone] (Leelan Rajewski's office).   

## 2020-01-05 ENCOUNTER — Ambulatory Visit: Payer: Medicaid Other | Admitting: Clinical

## 2020-01-05 DIAGNOSIS — Z91199 Patient's noncompliance with other medical treatment and regimen due to unspecified reason: Secondary | ICD-10-CM

## 2020-01-23 ENCOUNTER — Ambulatory Visit (INDEPENDENT_AMBULATORY_CARE_PROVIDER_SITE_OTHER): Payer: Medicaid Other | Admitting: Obstetrics & Gynecology

## 2020-01-23 ENCOUNTER — Other Ambulatory Visit: Payer: Self-pay

## 2020-01-23 ENCOUNTER — Encounter: Payer: Self-pay | Admitting: Obstetrics & Gynecology

## 2020-01-23 VITALS — BP 112/72 | HR 77 | Ht 68.0 in | Wt 164.0 lb

## 2020-01-23 DIAGNOSIS — Z3202 Encounter for pregnancy test, result negative: Secondary | ICD-10-CM | POA: Diagnosis not present

## 2020-01-23 DIAGNOSIS — Z30017 Encounter for initial prescription of implantable subdermal contraceptive: Secondary | ICD-10-CM | POA: Diagnosis not present

## 2020-01-23 MED ORDER — ETONOGESTREL 68 MG ~~LOC~~ IMPL
68.0000 mg | DRUG_IMPLANT | Freq: Once | SUBCUTANEOUS | Status: AC
  Administered 2020-01-23: 68 mg via SUBCUTANEOUS

## 2020-01-23 NOTE — Patient Instructions (Signed)
Etonogestrel implant What is this medicine? ETONOGESTREL (et oh noe JES trel) is a contraceptive (birth control) device. It is used to prevent pregnancy. It can be used for up to 3 years. This medicine may be used for other purposes; ask your health care provider or pharmacist if you have questions. COMMON BRAND NAME(S): Implanon, Nexplanon What should I tell my health care provider before I take this medicine? They need to know if you have any of these conditions:  abnormal vaginal bleeding  blood vessel disease or blood clots  breast, cervical, endometrial, ovarian, liver, or uterine cancer  diabetes  gallbladder disease  heart disease or recent heart attack  high blood pressure  high cholesterol or triglycerides  kidney disease  liver disease  migraine headaches  seizures  stroke  tobacco smoker  an unusual or allergic reaction to etonogestrel, anesthetics or antiseptics, other medicines, foods, dyes, or preservatives  pregnant or trying to get pregnant  breast-feeding How should I use this medicine? This device is inserted just under the skin on the inner side of your upper arm by a health care professional. Talk to your pediatrician regarding the use of this medicine in children. Special care may be needed. Overdosage: If you think you have taken too much of this medicine contact a poison control center or emergency room at once. NOTE: This medicine is only for you. Do not share this medicine with others. What if I miss a dose? This does not apply. What may interact with this medicine? Do not take this medicine with any of the following medications:  amprenavir  fosamprenavir This medicine may also interact with the following medications:  acitretin  aprepitant  armodafinil  bexarotene  bosentan  carbamazepine  certain medicines for fungal infections like fluconazole, ketoconazole, itraconazole and voriconazole  certain medicines to treat  hepatitis, HIV or AIDS  cyclosporine  felbamate  griseofulvin  lamotrigine  modafinil  oxcarbazepine  phenobarbital  phenytoin  primidone  rifabutin  rifampin  rifapentine  St. John's wort  topiramate This list may not describe all possible interactions. Give your health care provider a list of all the medicines, herbs, non-prescription drugs, or dietary supplements you use. Also tell them if you smoke, drink alcohol, or use illegal drugs. Some items may interact with your medicine. What should I watch for while using this medicine? This product does not protect you against HIV infection (AIDS) or other sexually transmitted diseases. You should be able to feel the implant by pressing your fingertips over the skin where it was inserted. Contact your doctor if you cannot feel the implant, and use a non-hormonal birth control method (such as condoms) until your doctor confirms that the implant is in place. Contact your doctor if you think that the implant may have broken or become bent while in your arm. You will receive a user card from your health care provider after the implant is inserted. The card is a record of the location of the implant in your upper arm and when it should be removed. Keep this card with your health records. What side effects may I notice from receiving this medicine? Side effects that you should report to your doctor or health care professional as soon as possible:  allergic reactions like skin rash, itching or hives, swelling of the face, lips, or tongue  breast lumps, breast tissue changes, or discharge  breathing problems  changes in emotions or moods  coughing up blood  if you feel that the implant   may have broken or bent while in your arm  high blood pressure  pain, irritation, swelling, or bruising at the insertion site  scar at site of insertion  signs of infection at the insertion site such as fever, and skin redness, pain or  discharge  signs and symptoms of a blood clot such as breathing problems; changes in vision; chest pain; severe, sudden headache; pain, swelling, warmth in the leg; trouble speaking; sudden numbness or weakness of the face, arm or leg  signs and symptoms of liver injury like dark yellow or brown urine; general ill feeling or flu-like symptoms; light-colored stools; loss of appetite; nausea; right upper belly pain; unusually weak or tired; yellowing of the eyes or skin  unusual vaginal bleeding, discharge Side effects that usually do not require medical attention (report to your doctor or health care professional if they continue or are bothersome):  acne  breast pain or tenderness  headache  irregular menstrual bleeding  nausea This list may not describe all possible side effects. Call your doctor for medical advice about side effects. You may report side effects to FDA at 1-800-FDA-1088. Where should I keep my medicine? This drug is given in a hospital or clinic and will not be stored at home. NOTE: This sheet is a summary. It may not cover all possible information. If you have questions about this medicine, talk to your doctor, pharmacist, or health care provider.  2020 Elsevier/Gold Standard (2019-01-11 11:33:04)  

## 2020-01-23 NOTE — Progress Notes (Signed)
GYNECOLOGY OFFICE PROCEDURE NOTE  Yvonne Grant is a 34 y.o. G1P1001 here for Nexplanon insertion.  Last pap smear was on 07/2019 and was LSIL, will return for colposcopy.  No other gynecologic concerns.  Nexplanon Insertion Procedure Patient identified, informed consent performed, consent signed.   Patient does understand that irregular bleeding is a very common side effect of this medication. She was advised to have backup contraception for one week after placement. Pregnancy test in clinic today was negative.  Appropriate time out taken.  Patient's left arm was prepped and draped in the usual sterile fashion. The ruler used to measure and mark insertion area.  Patient was prepped with alcohol swab and then injected with 3 ml of 1% lidocaine.  She was prepped with betadine, Nexplanon removed from packaging,  Device confirmed in needle, then inserted full length of needle and withdrawn per handbook instructions. Nexplanon was able to be palpated in the patient's arm; patient declined to palpate the insert herself. Nurse assistant could see the implant was in place.There was minimal blood loss.  Patient insertion site covered with gauze and a pressure bandage to reduce any bruising.  The patient tolerated the procedure well and was given post procedure instructions.         Adam Phenix, MD Attending Obstetrician & Gynecologist, [facility] and Center for [facility]  01/23/2020

## 2020-01-26 LAB — POCT PREGNANCY, URINE: Preg Test, Ur: NEGATIVE

## 2020-02-16 ENCOUNTER — Encounter: Payer: Self-pay | Admitting: *Deleted

## 2020-02-16 ENCOUNTER — Ambulatory Visit: Payer: Medicaid Other | Admitting: Obstetrics & Gynecology

## 2020-02-16 NOTE — Progress Notes (Signed)
[facility] colposcopy. Per discussion with Dr. Debroah Loop, needs to be rescheduled. Messaged registrar to reschedule. Kayston Jodoin,RN

## 2020-02-28 ENCOUNTER — Telehealth: Payer: Self-pay | Admitting: Obstetrics & Gynecology

## 2020-02-28 NOTE — Telephone Encounter (Signed)
Attempted to reach patient to get her rescheduled for her missed Colpo appointment. I was not able to leave a voicemail because the mailbox was full.

## 2021-05-07 ENCOUNTER — Emergency Department: Payer: Medicaid Other

## 2021-05-07 ENCOUNTER — Emergency Department
Admission: EM | Admit: 2021-05-07 | Discharge: 2021-05-07 | Disposition: A | Discharge status: Home / Self Care | Payer: Medicaid Other | Attending: Emergency Medicine | Admitting: Emergency Medicine

## 2021-05-07 ENCOUNTER — Encounter: Payer: Self-pay | Admitting: Emergency Medicine

## 2021-05-07 ENCOUNTER — Other Ambulatory Visit: Payer: Self-pay

## 2021-05-07 DIAGNOSIS — O209 Hemorrhage in early pregnancy, unspecified: Secondary | ICD-10-CM | POA: Diagnosis present

## 2021-05-07 DIAGNOSIS — O039 Complete or unspecified spontaneous abortion without complication: Secondary | ICD-10-CM | POA: Diagnosis not present

## 2021-05-07 DIAGNOSIS — N939 Abnormal uterine and vaginal bleeding, unspecified: Secondary | ICD-10-CM

## 2021-05-07 LAB — BASIC METABOLIC PANEL
Anion gap: 6 (ref 5–15)
BUN: <5 mg/dL — ABNORMAL LOW (ref 6–20)
CO2: 21 mmol/L — ABNORMAL LOW (ref 22–32)
Calcium: 8.9 mg/dL (ref 8.9–10.3)
Chloride: 108 mmol/L (ref 98–111)
Creatinine, Ser: 0.59 mg/dL (ref 0.44–1.00)
GFR, Estimated: >60 mL/min (ref >60)
Glucose, Bld: 112 mg/dL — ABNORMAL HIGH (ref 70–99)
Potassium: 3 mmol/L — ABNORMAL LOW (ref 3.5–5.1)
Sodium: 135 mmol/L (ref 135–145)

## 2021-05-07 LAB — CBC WITH DIFFERENTIAL/PLATELET
Abs Immature Granulocytes: 0.02 10*3/uL (ref 0.00–0.07)
Basophils Absolute: 0 10*3/uL (ref 0.0–0.1)
Basophils Relative: 0 %
Eosinophils Absolute: 0.2 10*3/uL (ref 0.0–0.5)
Eosinophils Relative: 3 %
HCT: 35.5 % — ABNORMAL LOW (ref 36.0–46.0)
Hemoglobin: 12.1 g/dL (ref 12.0–15.0)
Immature Granulocytes: 0 %
Lymphocytes Relative: 21 %
Lymphs Abs: 1.6 10*3/uL (ref 0.7–4.0)
MCH: 31 pg (ref 26.0–34.0)
MCHC: 34.1 g/dL (ref 30.0–36.0)
MCV: 91 fL (ref 80.0–100.0)
Monocytes Absolute: 0.6 10*3/uL (ref 0.1–1.0)
Monocytes Relative: 8 %
Neutro Abs: 5 10*3/uL (ref 1.7–7.7)
Neutrophils Relative %: 68 %
Platelets: 256 10*3/uL (ref 150–400)
RBC: 3.9 MIL/uL (ref 3.87–5.11)
RDW: 12.8 % (ref 11.5–15.5)
WBC: 7.3 10*3/uL (ref 4.0–10.5)
nRBC: 0 % (ref 0.0–0.2)

## 2021-05-07 LAB — HCG, QUANTITATIVE, PREGNANCY: hCG, Beta Chain, Quant, S: 3324 m[IU]/mL — ABNORMAL HIGH (ref <5)

## 2021-05-07 LAB — ANTIBODY SCREEN: Antibody Screen: NEGATIVE

## 2021-05-07 LAB — POC URINE PREG, ED: Preg Test, Ur: POSITIVE — AB

## 2021-05-07 LAB — ABO/RH: ABO/RH(D): A NEG

## 2021-05-07 MED ORDER — KETOROLAC TROMETHAMINE 30 MG/ML IJ SOLN
15.0000 mg | Freq: Once | INTRAMUSCULAR | Status: AC
  Administered 2021-05-07: 05:00:00 15 mg via INTRAVENOUS
  Filled 2021-05-07: qty 1

## 2021-05-07 MED ORDER — RHO D IMMUNE GLOBULIN 1500 UNIT/2ML IJ SOSY
300.0000 ug | PREFILLED_SYRINGE | Freq: Once | INTRAMUSCULAR | Status: AC
  Administered 2021-05-07: 07:00:00 300 ug via INTRAMUSCULAR
  Filled 2021-05-07: qty 2

## 2021-05-07 NOTE — ED Triage Notes (Signed)
Pt to triage via w/c, tearful; st approx 22mos pregnant, G2P1 EDC 8/2; vag bleeding and mid lower abd cramping tonight

## 2021-05-07 NOTE — ED Provider Notes (Signed)
[facility] Provider Note    Event Date/Time   First MD Initiated Contact with Patient 05/07/21 [phone]     (approximate)   History   Vaginal Bleeding   HPI  Yvonne Grant is a 36 y.o. female G2 P1 currently at 12 weeks per LMP who presents for evaluation of vaginal bleeding.  Patient reports that she started having vaginal bleeding tonight.  Initially mild but then started passing large amount of blood and some clots.  She is also complaining of suprapubic intermittent cramping abdominal pain that she reports feels like contractions.  She had no complications with her first pregnancy.  She has no history of bleeding disorder.  She has established care for this pregnancy with her OB/GYN but still has not had an ultrasound.  She denies any dizziness, syncope, chest pain or shortness of breath.     Past Medical History:  Diagnosis Date   Medical history non-contributory     Past Surgical History:  Procedure Laterality Date   COLPOSCOPY  09/26/2019       NO PAST SURGERIES       Physical Exam   Triage Vital Signs: ED Triage Vitals  Enc Vitals Group     BP 05/07/21 0156 127/85     Pulse Rate 05/07/21 0156 (!) 120     Resp 05/07/21 0156 20     Temp 05/07/21 0156 98.6 F (37 C)     Temp Source 05/07/21 0156 Oral     SpO2 05/07/21 0156 96 %     Weight 05/07/21 0157 175 lb (79.4 kg)     Height 05/07/21 0157 5\' 7"  (1.702 m)     Head Circumference --      Peak Flow --      Pain Score 05/07/21 0157 9     Pain Loc --      Pain Edu? --      Excl. in GC? --     Most recent vital signs: Vitals:   05/07/21 0430 05/07/21 0500  BP: 107/64 129/84  Pulse: (!) 49 64  Resp: 18 17  Temp:    SpO2: 98% 99%     Constitutional: Alert and oriented. Well appearing and in no apparent distress. HEENT:      Head: Normocephalic and atraumatic.         Eyes: Conjunctivae are normal. Sclera is non-icteric.       Mouth/Throat: Mucous membranes are moist.        Neck: Supple with no signs of meningismus. Cardiovascular: Regular rate and rhythm. No murmurs, gallops, or rubs. 2+ symmetrical distal pulses are present in all extremities.  Respiratory: Normal respiratory effort. Lungs are clear to auscultation bilaterally.  Gastrointestinal: Soft, mild suprapubic tenderness, and non distended with positive bowel sounds. No rebound or guarding. Musculoskeletal:  No edema, cyanosis, or erythema of extremities. Neurologic: Normal speech and language. Face is symmetric. Moving all extremities. No gross focal neurologic deficits are appreciated. Skin: Skin is warm, dry and intact. No rash noted. Psychiatric: Mood and affect are normal. Speech and behavior are normal.  ED Results / Procedures / Treatments   Labs (all labs ordered are listed, but only abnormal results are displayed) Labs Reviewed  CBC WITH DIFFERENTIAL/PLATELET - Abnormal; Notable for the following components:      Result Value   HCT 35.5 (*)    All other components within normal limits  BASIC METABOLIC PANEL - Abnormal; Notable for the following components:   Potassium  3.0 (*)    CO2 21 (*)    Glucose, Bld 112 (*)    BUN <5 (*)    All other components within normal limits  HCG, QUANTITATIVE, PREGNANCY - Abnormal; Notable for the following components:   hCG, Beta Chain, Quant, S 3,324 (*)    All other components within normal limits  ABO/RH  ABO/RH  RH IG WORKUP (INCLUDES ABO/RH)  TYPE AND SCREEN  ANTIBODY SCREEN  RHOGAM INJECTION     EKG  none   RADIOLOGY I, Nita Sicklearolina Vee Bahe, attending MD, have personally viewed and interpreted the images obtained during this visit as below:  Ultrasound showing IUP with no heart rate consistent with fetal demise   ___________________________________________________ Interpretation by Radiologist:  US OB LESS THAN 14 WEEKS WITH OB TRANSVAGINAL  Result Date: 05/07/2021 CLINICAL DATA:  First trimester bleeding. EXAM: OBSTETRIC <14 WK US  AND TRANSVAGINAL OB US TECHNIQUE: Both transabdominal and transvaginal ultrasound examinations were performed for complete evaluation of the gestation as well as the maternal uterus, adnexal regions, and pelvic cul-de-sac. Transvaginal technique was performed to assess early pregnancy. COMPARISON:  None. FINDINGS: Intrauterine gestational sac: Single Yolk sac:  Not Visualized. Embryo:  Visualized. Cardiac Activity: Not Visualized. Heart Rate: N/a bpm MSD: Not measured due to pregnancy age. CRL:  23.9 mm   9 w   1 d +/-6 days,  US EDC: 12/09/2021 Clinical age: 40 weeks 6 days by LMP. Subchorionic hemorrhage:  None visualized. Maternal uterus/adnexae: The uterus is anteverted and demonstrates no wall mass. There is shortening of the cervix length to 1.2 cm with the upper to mid cervix both open including internal os, to 1.7 cm, with prolapse of the membranes into the cervix. Neither ovary was visualized. No free fluid is seen. IMPRESSION: Pulseless fetus measuring 9 weeks 1 day consistent with intrauterine fetal demise, with shortened cervix and open internal os and proximal cervix containing prolapsed membranes. Findings most likely representing an abortion in progress. Nonvisualization of the ovaries. Electronically Signed   By: Almira BarKeith  Chesser M.D.   On: 05/07/2021 04:36      PROCEDURES:  Critical Care performed: No  Procedures    IMPRESSION / MDM / ASSESSMENT AND PLAN / ED COURSE  I reviewed the triage vital signs and the nursing notes.  36 y.o. female G2 P1 currently at 12 weeks per LMP who presents for evaluation of vaginal bleeding.  Patient is hemodynamically stable with mild suprapubic tenderness, no rebound or guarding  Ddx: Bleeding first trimester versus subchorionic hemorrhage versus threatened miscarriage   Plan: Transvaginal ultrasound, hCG, ABO Rh, CBC, BMP.  Patient declined any pain medication at this time.   MEDICATIONS GIVEN IN ED: Medications  rho (d) immune globulin  (RHIG/RHOPHYLAC) injection 300 mcg (has no administration in time range)  ketorolac (TORADOL) 30 MG/ML injection 15 mg (has no administration in time range)     ED COURSE: Ultrasound consistent with a 9-week intrauterine gestation with no heartbeat consistent with fetal demise.  Results were discussed with the patient and her husband and counseling was provided as well.  Labs showing normal hemoglobin at 12.1.  hCG of 3324.  Patient's blood type is A- therefore she will need a shot of RhoGAM which has been ordered.  Discussed follow-up with OB/GYN once bleeding subsides for repeat ultrasound.  In the meantime discussed signs and symptoms of acute blood loss anemia and recommended return to the emergency room if these develop.  Admission was considered however since patient is hemodynamically stable  with no severe life-threatening bleed and normal hemoglobin, I feel that is safe for her to be discharged at this time.  We will provide a dose of IV Toradol for pain   Consults: None   EMR reviewed including last visit with her OB in 2021 for Nexplanon insertion    FINAL CLINICAL IMPRESSION(S) / ED DIAGNOSES   Final diagnoses:  Vaginal bleeding  Miscarriage     Rx / DC Orders   ED Discharge Orders     None        Note:  This document was prepared using Dragon voice recognition software and may include unintentional dictation errors.   Please note:  Patient was evaluated in Emergency Department today for the symptoms described in the history of present illness. Patient was evaluated in the context of the global COVID-19 pandemic, which necessitated consideration that the patient might be at risk for infection with the SARS-CoV-2 virus that causes COVID-19. Institutional protocols and algorithms that pertain to the evaluation of patients at risk for COVID-19 are in a state of rapid change based on information released by regulatory bodies including the CDC and federal and state  organizations. These policies and algorithms were followed during the patient's care in the ED.  Some ED evaluations and interventions may be delayed as a result of limited staffing during the pandemic.       Don Perking, [place], MD 05/07/21 [phone]

## 2021-05-07 NOTE — ED Notes (Signed)
Pt to US.

## 2021-05-08 LAB — RHOGAM INJECTION: Unit division: 0

## 2021-05-14 ENCOUNTER — Ambulatory Visit: Payer: Medicaid Other | Admitting: Obstetrics and Gynecology

## 2021-05-14 ENCOUNTER — Encounter: Payer: Self-pay | Admitting: Obstetrics and Gynecology

## 2021-05-14 DIAGNOSIS — O021 Missed abortion: Secondary | ICD-10-CM | POA: Insufficient documentation

## 2021-05-14 DIAGNOSIS — O26899 Other specified pregnancy related conditions, unspecified trimester: Secondary | ICD-10-CM | POA: Insufficient documentation

## 2021-05-14 DIAGNOSIS — O200 Threatened abortion: Secondary | ICD-10-CM | POA: Insufficient documentation

## 2021-05-14 DIAGNOSIS — Z6791 Unspecified blood type, Rh negative: Secondary | ICD-10-CM | POA: Insufficient documentation

## 2021-05-15 NOTE — Progress Notes (Unsigned)
Patient did not keep her GYN appointment for 05/14/2021.  Cornelia Copa MD Attending Center for [employer] [facility])

## 2021-05-16 ENCOUNTER — Telehealth: Payer: Self-pay | Admitting: Family Medicine

## 2021-05-16 ENCOUNTER — Telehealth: Payer: Self-pay | Admitting: Lactation Services

## 2021-05-16 ENCOUNTER — Encounter: Payer: Self-pay | Admitting: Family Medicine

## 2021-05-16 NOTE — Telephone Encounter (Signed)
Called patient in regards to rescheduling her appointment, there was no answer to the phone call so a letter will be mailed out to the patient.

## 2021-05-16 NOTE — Telephone Encounter (Signed)
-----   Message from Riverside Bing, MD sent at 05/15/2021 10:11 AM EST ----- Regarding: touch base She missed her appt yesterday. Can y'all touch base with her and try and reschedule her with an MD that does GYN surgery if you can contact her? thanks

## 2021-05-16 NOTE — Telephone Encounter (Signed)
Called patient to discuss her missed appointment. Patient did not answer. LM for her to call the office at [phone] to reschedule her appointment. Patient does not have My Chart. Forwarded to front office to call patient to reschedule.

## 2021-05-28 ENCOUNTER — Other Ambulatory Visit: Payer: Self-pay

## 2021-05-28 ENCOUNTER — Other Ambulatory Visit (HOSPITAL_COMMUNITY)
Admission: RE | Admit: 2021-05-28 | Discharge: 2021-05-28 | Disposition: A | Discharge status: Home / Self Care | Payer: Medicaid Other | Source: Ambulatory Visit | Attending: Family Medicine | Admitting: Family Medicine

## 2021-05-28 ENCOUNTER — Ambulatory Visit (INDEPENDENT_AMBULATORY_CARE_PROVIDER_SITE_OTHER): Payer: Medicaid Other

## 2021-05-28 VITALS — BP 114/72 | HR 79 | Wt 191.8 lb

## 2021-05-28 DIAGNOSIS — N898 Other specified noninflammatory disorders of vagina: Secondary | ICD-10-CM | POA: Diagnosis present

## 2021-05-28 NOTE — Progress Notes (Signed)
Patient here today with a complaint of malodorous discharge. Patient denies any vaginal itching, pain or burning with urination. Instructed patient on how to collect a self swab. Self swab collected without issue. Instructed patient we will notify her with any abnormal results. Patient denies any other concerns or questions.   Paulina Fusi, RN  05/28/21

## 2021-05-29 ENCOUNTER — Other Ambulatory Visit: Payer: Self-pay | Admitting: Nurse Practitioner

## 2021-05-29 DIAGNOSIS — B9689 Other specified bacterial agents as the cause of diseases classified elsewhere: Secondary | ICD-10-CM

## 2021-05-29 LAB — CERVICOVAGINAL ANCILLARY ONLY
Bacterial Vaginitis (gardnerella): POSITIVE — AB
Candida Glabrata: POSITIVE — AB
Candida Vaginitis: NEGATIVE
Chlamydia: NEGATIVE
Comment: NEGATIVE
Comment: NEGATIVE
Comment: NEGATIVE
Comment: NEGATIVE
Comment: NEGATIVE
Comment: NORMAL
Neisseria Gonorrhea: NEGATIVE
Trichomonas: NEGATIVE

## 2021-05-29 MED ORDER — FLUCONAZOLE 150 MG PO TABS: 150.0000 mg | ORAL_TABLET | Freq: Once | ORAL | 0 refills | Status: AC

## 2021-05-29 MED ORDER — METRONIDAZOLE 500 MG PO TABS: 500.0000 mg | ORAL_TABLET | Freq: Two times a day (BID) | ORAL | 0 refills | Status: DC

## 2021-05-30 ENCOUNTER — Telehealth: Payer: Self-pay

## 2021-05-30 NOTE — Telephone Encounter (Signed)
I called patient and left her a voicemail stating I had results to review with her and I requested patient give [place] a call back at the clinic.   Alesia Richards, RN 05/30/21

## 2021-05-31 ENCOUNTER — Telehealth: Payer: Self-pay | Admitting: Lactation Services

## 2021-05-31 NOTE — Telephone Encounter (Signed)
-----   Message from Currie Paris, NP sent at 05/29/2021  9:59 PM EST ----- Based on her lab results and clinical symptoms, I am prescribing 2 medications.  Take the Flagyl BID for 7 days and then after antibiotics are finished, take diflucan by mouth.  There was no access to MyChart for patient.

## 2021-05-31 NOTE — Telephone Encounter (Signed)
Called patient to give results. She did not answer. LM for her to pick up medications from Pharmacy and begin taking. LM to finish 7 ATB first and then take the single pill afterwards. LM to take with food and no alcohol while taking. Asked patient to call the office at her convenience for results.

## 2021-06-04 NOTE — Telephone Encounter (Signed)
Called patient; VM left stating patient has new results and medications and may call the office for more information.

## 2021-11-15 ENCOUNTER — Encounter: Payer: Self-pay | Admitting: Internal Medicine

## 2021-11-27 ENCOUNTER — Encounter: Payer: Self-pay | Admitting: Emergency Medicine

## 2021-11-27 ENCOUNTER — Observation Stay
Admission: EM | Admit: 2021-11-27 | Discharge: 2021-11-28 | Disposition: A | Discharge status: Home / Self Care | Payer: Medicaid Other | Attending: Internal Medicine | Admitting: Internal Medicine

## 2021-11-27 ENCOUNTER — Other Ambulatory Visit: Payer: Self-pay

## 2021-11-27 DIAGNOSIS — Z87891 Personal history of nicotine dependence: Secondary | ICD-10-CM | POA: Diagnosis not present

## 2021-11-27 DIAGNOSIS — E876 Hypokalemia: Secondary | ICD-10-CM | POA: Diagnosis not present

## 2021-11-27 DIAGNOSIS — F419 Anxiety disorder, unspecified: Secondary | ICD-10-CM | POA: Diagnosis not present

## 2021-11-27 DIAGNOSIS — R799 Abnormal finding of blood chemistry, unspecified: Secondary | ICD-10-CM | POA: Diagnosis present

## 2021-11-27 DIAGNOSIS — D509 Iron deficiency anemia, unspecified: Secondary | ICD-10-CM

## 2021-11-27 LAB — BASIC METABOLIC PANEL
Anion gap: 9 (ref 5–15)
BUN: <5 mg/dL — ABNORMAL LOW (ref 6–20)
CO2: 23 mmol/L (ref 22–32)
Calcium: 8.9 mg/dL (ref 8.9–10.3)
Chloride: 105 mmol/L (ref 98–111)
Creatinine, Ser: 0.55 mg/dL (ref 0.44–1.00)
GFR, Estimated: >60 mL/min (ref >60)
Glucose, Bld: 85 mg/dL (ref 70–99)
Potassium: <2 mmol/L — CL (ref 3.5–5.1)
Sodium: 137 mmol/L (ref 135–145)

## 2021-11-27 LAB — CBC WITH DIFFERENTIAL/PLATELET
Abs Immature Granulocytes: 0.01 10*3/uL (ref 0.00–0.07)
Basophils Absolute: 0 10*3/uL (ref 0.0–0.1)
Basophils Relative: 1 %
Eosinophils Absolute: 0.1 10*3/uL (ref 0.0–0.5)
Eosinophils Relative: 2 %
HCT: 33.9 % — ABNORMAL LOW (ref 36.0–46.0)
Hemoglobin: 10.1 g/dL — ABNORMAL LOW (ref 12.0–15.0)
Immature Granulocytes: 0 %
Lymphocytes Relative: 26 %
Lymphs Abs: 1.1 10*3/uL (ref 0.7–4.0)
MCH: 23.4 pg — ABNORMAL LOW (ref 26.0–34.0)
MCHC: 29.8 g/dL — ABNORMAL LOW (ref 30.0–36.0)
MCV: 78.5 fL — ABNORMAL LOW (ref 80.0–100.0)
Monocytes Absolute: 0.6 10*3/uL (ref 0.1–1.0)
Monocytes Relative: 13 %
Neutro Abs: 2.5 10*3/uL (ref 1.7–7.7)
Neutrophils Relative %: 58 %
Platelets: 342 10*3/uL (ref 150–400)
RBC: 4.32 MIL/uL (ref 3.87–5.11)
RDW: 15.4 % (ref 11.5–15.5)
WBC: 4.3 10*3/uL (ref 4.0–10.5)
nRBC: 0 % (ref 0.0–0.2)

## 2021-11-27 LAB — IRON AND TIBC
Iron: 26 ug/dL — ABNORMAL LOW (ref 28–170)
Saturation Ratios: 6 % — ABNORMAL LOW (ref 10.4–31.8)
TIBC: 416 ug/dL (ref 250–450)
UIBC: 390 ug/dL

## 2021-11-27 LAB — LACTATE DEHYDROGENASE: LDH: 118 U/L (ref 98–192)

## 2021-11-27 LAB — VITAMIN B12: Vitamin B-12: 285 pg/mL (ref 180–914)

## 2021-11-27 LAB — MAGNESIUM: Magnesium: 1.8 mg/dL (ref 1.7–2.4)

## 2021-11-27 LAB — RETICULOCYTES
Immature Retic Fract: 11.7 % (ref 2.3–15.9)
RBC.: 4.32 MIL/uL (ref 3.87–5.11)
Retic Count, Absolute: 36.8 10*3/uL (ref 19.0–186.0)
Retic Ct Pct: 0.9 % (ref 0.4–3.1)

## 2021-11-27 LAB — PHOSPHORUS: Phosphorus: 2.7 mg/dL (ref 2.5–4.6)

## 2021-11-27 LAB — HIV ANTIBODY (ROUTINE TESTING W REFLEX): HIV Screen 4th Generation wRfx: NONREACTIVE

## 2021-11-27 LAB — FERRITIN: Ferritin: 7 ng/mL — ABNORMAL LOW (ref 11–307)

## 2021-11-27 LAB — FOLATE: Folate: 20.8 ng/mL (ref >5.9)

## 2021-11-27 LAB — HCG, QUANTITATIVE, PREGNANCY: hCG, Beta Chain, Quant, S: 1 m[IU]/mL (ref <5)

## 2021-11-27 LAB — POTASSIUM: Potassium: 2.7 mmol/L — CL (ref 3.5–5.1)

## 2021-11-27 MED ORDER — PANTOPRAZOLE SODIUM 40 MG PO TBEC
40.0000 mg | DELAYED_RELEASE_TABLET | Freq: Every day | ORAL | Status: DC
  Administered 2021-11-27 – 2021-11-28 (×2): 40 mg via ORAL
  Filled 2021-11-27 (×2): qty 1

## 2021-11-27 MED ORDER — ONDANSETRON HCL 4 MG/2ML IJ SOLN: 4.0000 mg | Freq: Four times a day (QID) | INTRAMUSCULAR | Status: DC | PRN

## 2021-11-27 MED ORDER — ACETAMINOPHEN 325 MG PO TABS: 650.0000 mg | ORAL_TABLET | Freq: Four times a day (QID) | ORAL | Status: DC | PRN

## 2021-11-27 MED ORDER — BUSPIRONE HCL 10 MG PO TABS
5.0000 mg | ORAL_TABLET | Freq: Two times a day (BID) | ORAL | Status: DC
  Administered 2021-11-27 – 2021-11-28 (×2): 5 mg via ORAL
  Filled 2021-11-27 (×2): qty 1

## 2021-11-27 MED ORDER — ONDANSETRON HCL 4 MG PO TABS: 4.0000 mg | ORAL_TABLET | Freq: Four times a day (QID) | ORAL | Status: DC | PRN

## 2021-11-27 MED ORDER — POTASSIUM CHLORIDE CRYS ER 20 MEQ PO TBCR
40.0000 meq | EXTENDED_RELEASE_TABLET | ORAL | Status: AC
  Administered 2021-11-27 (×2): 40 meq via ORAL
  Filled 2021-11-27 (×2): qty 2

## 2021-11-27 MED ORDER — ADULT MULTIVITAMIN W/MINERALS CH
1.0000 | ORAL_TABLET | Freq: Every day | ORAL | Status: DC
Start: 2021-11-27 — End: 2021-11-28
  Administered 2021-11-27 – 2021-11-28 (×2): 1 via ORAL
  Filled 2021-11-27 (×2): qty 1

## 2021-11-27 MED ORDER — ENOXAPARIN SODIUM 40 MG/0.4ML IJ SOSY: 40.0000 mg | PREFILLED_SYRINGE | INTRAMUSCULAR | Status: DC

## 2021-11-27 MED ORDER — POTASSIUM CHLORIDE 10 MEQ/100ML IV SOLN
10.0000 meq | INTRAVENOUS | Status: AC
  Administered 2021-11-27 (×4): 10 meq via INTRAVENOUS
  Filled 2021-11-27 (×2): qty 100

## 2021-11-27 MED ORDER — ACETAMINOPHEN 650 MG RE SUPP: 650.0000 mg | Freq: Four times a day (QID) | RECTAL | Status: DC | PRN

## 2021-11-27 MED ORDER — MAGNESIUM HYDROXIDE 400 MG/5ML PO SUSP: 30.0000 mL | Freq: Every day | ORAL | Status: DC | PRN

## 2021-11-27 MED ORDER — POTASSIUM CHLORIDE IN NACL 40-0.9 MEQ/L-% IV SOLN
INTRAVENOUS | Status: DC
  Filled 2021-11-27 (×4): qty 1000

## 2021-11-27 MED ORDER — POTASSIUM CHLORIDE 20 MEQ PO PACK
40.0000 meq | PACK | Freq: Once | ORAL | Status: AC
  Administered 2021-11-27: 40 meq via ORAL
  Filled 2021-11-27: qty 2

## 2021-11-27 MED ORDER — TRAZODONE HCL 50 MG PO TABS
25.0000 mg | ORAL_TABLET | Freq: Every evening | ORAL | Status: DC | PRN
  Administered 2021-11-27: 25 mg via ORAL
  Filled 2021-11-27: qty 1

## 2021-11-27 MED ORDER — POTASSIUM CHLORIDE CRYS ER 20 MEQ PO TBCR
40.0000 meq | EXTENDED_RELEASE_TABLET | Freq: Once | ORAL | Status: AC
  Administered 2021-11-27: 40 meq via ORAL
  Filled 2021-11-27: qty 2

## 2021-11-27 NOTE — ED Notes (Signed)
Informed rn bed assigned 

## 2021-11-27 NOTE — ED Provider Notes (Signed)
[facility] Provider Note    Event Date/Time   First MD Initiated Contact with Patient 11/27/21 1025     (approximate)   History   Abnormal Lab  Patient is a 36 year old female with a history of alcohol use who recently discontinued alcohol about 1-1/2 months ago being treated by outpatient provider.  No withdrawals.  She was sent into the emergency department today because her outpatient labs showed hypokalemia.  She has otherwise been feeling well, no complaints, denies nausea vomiting diarrhea.  No restrictive eating, diets, wt loss pills, purging.  No bleeding.  Also states that she has been told she has iron deficient anemia.  States that she had a very poor diet when she was drinking alcohol daily, but her diet has improved remarkably and has been eating well and taking multivitamins for the past month.  No complaints aside from her lab abnormality.  Denies other significant past medical history.    Physical Exam   Triage Vital Signs: ED Triage Vitals  Enc Vitals Group     BP 11/27/21 1014 (!) 134/90     Pulse Rate 11/27/21 1014 86     Resp 11/27/21 1014 18     Temp 11/27/21 1014 98.2 F (36.8 C)     Temp Source 11/27/21 1014 Oral     SpO2 11/27/21 1014 98 %     Weight 11/27/21 1015 165 lb (74.8 kg)     Height 11/27/21 1015 5\' 6"  (1.676 m)     Head Circumference --      Peak Flow --      Pain Score 11/27/21 1015 0     Pain Loc --      Pain Edu? --      Excl. in GC? --     Most recent vital signs: Vitals:   11/27/21 1014 11/27/21 1200  BP: (!) 134/90 109/83  Pulse: 86 68  Resp: 18 20  Temp: 98.2 F (36.8 C)   SpO2: 98% 100%     General: Awake, no distress.  CV:  Good peripheral perfusion.  Resp:  Normal effort.  Abd:  No distention.  Tender to palpation. Other:  Mentating clearly, no tremors, tongue fasciculation, hemodynamics appropriate and reassuring. Steady gait.   ED Results / Procedures / Treatments   Labs (all  labs ordered are listed, but only abnormal results are displayed) Labs Reviewed  BASIC METABOLIC PANEL - Abnormal; Notable for the following components:      Result Value   Potassium <2.0 (*)    BUN <5 (*)    All other components within normal limits  MAGNESIUM  PHOSPHORUS  PREGNANCY, URINE  HCG, QUANTITATIVE, PREGNANCY     EKG  ED ECG REPORT I, 11/29/21, the attending physician, personally viewed and interpreted this ECG.   Date: 11/27/2021  EKG Time: 1023  Rate: 68  Rhythm: normal EKG, normal sinus rhythm, normal sinus rhythm  Axis: normal  Intervals:none  ST&T Change: none    RADIOLOGY None   PROCEDURES:  Critical Care performed: Yes, see critical care procedure note(s)  .Critical Care  Performed by: 11/29/2021, MD Authorized by: Pilar Jarvis, MD   Critical care provider statement:    Critical care time (minutes):  30   Critical care was necessary to treat or prevent imminent or life-threatening deterioration of the following conditions:  Metabolic crisis   Critical care was time spent personally by me on the following activities:  Ordering and performing treatments and  interventions, ordering and review of laboratory studies, re-evaluation of patient's condition, review of old charts, examination of patient, evaluation of patient's response to treatment, discussions with consultants, development of treatment plan with patient or surrogate and obtaining history from patient or surrogate   Care discussed with: admitting provider   .1-3 Lead EKG Interpretation  Performed by: Pilar Jarvis, MD Authorized by: Pilar Jarvis, MD     Interpretation: normal     ECG rate:  80   ECG rate assessment: normal     Rhythm: sinus rhythm     Ectopy: none     Conduction: normal      MEDICATIONS ORDERED IN ED: Medications  potassium chloride 10 mEq in 100 mL IVPB (10 mEq Intravenous New Bag/Given 11/27/21 1211)  potassium chloride SA (KLOR-CON M) CR tablet 40 mEq (40 mEq  Oral Given 11/27/21 1208)     IMPRESSION / MDM / ASSESSMENT AND PLAN / ED COURSE  I reviewed the triage vital signs and the nursing notes.   Differential diagnosis includes, but is not limited to, electrolyte derangement, metabolic emergency, arrhythmia, dehydration.  Patient's presentation is most consistent with acute presentation with potential threat to life or bodily function.  The patient is on the cardiac monitor to evaluate for evidence of arrhythmia and/or significant heart rate changes.  Reviewed the patient's labs which includes a critically low potassium under 2, patient was ordered and given potassium repletion via IV and oral.  She was placed on the cardiac monitor.   I spoke with hospitalist consultant to discuss further plan of care. Admitted to hospitalist service.      FINAL CLINICAL IMPRESSION(S) / ED DIAGNOSES   Final diagnoses:  Hypokalemia     Rx / DC Orders   ED Discharge Orders     None        Note:  This document was prepared using Dragon voice recognition software and may include unintentional dictation errors.    Pilar Jarvis, MD 11/27/21 1218

## 2021-11-27 NOTE — Plan of Care (Signed)

## 2021-11-27 NOTE — ED Notes (Signed)
Pt presents to ED with c/o of being sent by PCP due to recent abnormal labs, specifically K+. Pt states she was being seen to get placed on new anxiety medications and requested blood work from PCP and states she was called and told to come here for further eval of abnormal labs. Pt denies SOB or CP. Pt is A&Ox4 at this time.   Pt does endorse she has vomited once a day for "awhile". Pt does also state she drank ETOH daily up until 1 month ago. Pt states she has been able to stop and has not drink more than 1 drink a day in the past month. Pt denies any illicit drug use to this RN. NAD noted at this time.

## 2021-11-27 NOTE — ED Notes (Signed)
Critical K+ resulted face to face with Dr. Modesto Charon

## 2021-11-27 NOTE — Assessment & Plan Note (Addendum)
-   We will continue BuSpar. - The patient denied any diarrhea that could have happened from BuSpar since she took it a month ago.

## 2021-11-27 NOTE — ED Triage Notes (Signed)
Patient reports having labs drawn yesterday at [facility] department and received a phone call this morning that her potassium was low. Denies any pain or complaints.

## 2021-11-27 NOTE — Assessment & Plan Note (Signed)
-   We will check anemia work-up including stool Hemoccult.

## 2021-11-27 NOTE — Assessment & Plan Note (Signed)
-   The patient will be admitted to a medical telemetry bed. - Potassium will be replaced and level will be repeated. - She will be hydrated with IV normal saline with added potassium chloride. - We will monitor her for arrhythmias. - Her sodium level and renal functions were unremarkable.

## 2021-11-27 NOTE — H&P (Signed)
[place]   PATIENT NAME: Yvonne Grant    MR#:  [id number]  DATE OF BIRTH:  11-Jul-1985  DATE OF ADMISSION:  11/27/2021  PRIMARY CARE PHYSICIAN: Patient, No Pcp Per   Patient is coming from: Home  REQUESTING/REFERRING PHYSICIAN: Pilar Jarvis, MD  CHIEF COMPLAINT:   Chief Complaint  Patient presents with   Abnormal Lab    HISTORY OF PRESENT ILLNESS:  Yvonne Grant is a 36 y.o. Caucasian female with medical history significant for hypokalemia, anxiety as well as alcohol abuse who quit drinking about a month ago and presented to the emergency room after being called about dangerously low potassium by her PCP.  The patient stated that about a month ago she had labs that showed low potassium but it was not addressed.  I have no current records to recommend that but the patient insists that this happened.  She denies any nausea or vomiting or diarrhea.  No focal muscle weakness.  No chest pain or palpitations.  No headache or dizziness or blurred vision.  She has been only taking BuSpar for anxiety.  No dysuria, oliguria or hematuria or flank pain.  No cough or wheezing or hemoptysis.  No bleeding diathesis.  She denies  taking any new medications or herbal supplements.  ED Course: Upon presentation to the ER, BP was 134/90 with otherwise normal vital signs.  Labs revealed a potassium less than 2 with otherwise unremarkable BMP.  CBC showed anemia with hemoglobin of 10.1 hematocrit 33.9 with microcytosis, compared to 12.1 and 35.5 on 05/03/2021. EKG as reviewed by me : EKG showed normal sinus rhythm rate of 68 with poor R wave progression and nonspecific intraventricular conduction delay.   The patient was given 40 mill [place] 1 p.o. potassium chloride and 10 mcg IV potassium chloride.  She will be admitted to a medical telemetry observation bed for further evaluation and management. PAST MEDICAL HISTORY:  Hypokalemia   PAST SURGICAL HISTORY:   Past Surgical History:   Procedure Laterality Date   COLPOSCOPY  09/26/2019       NO PAST SURGERIES      SOCIAL HISTORY:   Social History   Tobacco Use   Smoking status: Former    Types: Cigarettes    Quit date: 06/10/2018    Years since quitting: 3.4   Smokeless tobacco: Former  Substance Use Topics   Alcohol use: Not Currently    FAMILY HISTORY:   Family History  Problem Relation Age of Onset   Diabetes Maternal Grandmother     DRUG ALLERGIES:  No Known Allergies  REVIEW OF SYSTEMS:   ROS As per history of present illness. All pertinent systems were reviewed above. Constitutional, HEENT, cardiovascular, respiratory, GI, GU, musculoskeletal, neuro, psychiatric, endocrine, integumentary and hematologic systems were reviewed and are otherwise negative/unremarkable except for positive findings mentioned above in the HPI.   MEDICATIONS AT HOME:   Prior to Admission medications   Medication Sig Start Date End Date Taking? Authorizing Provider  busPIRone (BUSPAR) 5 MG tablet Take 5 mg by mouth 2 (two) times daily. Patient not taking: Reported on 11/27/2021 11/26/21   [provider]  metroNIDAZOLE (FLAGYL) 500 MG tablet Take 1 tablet (500 mg total) by mouth 2 (two) times daily. No alcohol while taking this medication Patient not taking: Reported on 11/27/2021 05/29/21   Currie Paris, NP  Multiple Vitamin (MULTIVITAMIN WITH MINERALS) TABS tablet Take 1 tablet by mouth daily.    [provider]  omeprazole (PRILOSEC) 20 MG capsule Take 20 mg by mouth 2 (two) times daily. 11/26/21   [provider]  Prenatal Vit-Fe Fumarate-FA (WESTAB PLUS) 27-1 MG TABS Take 1 tablet by mouth daily. Patient not taking: Reported on 05/28/2021 09/23/19   [provider]      VITAL SIGNS:  Blood pressure 125/80, pulse 68, temperature 98 F (36.7 C), temperature source Oral, resp. rate 15, height 5\' 6"  (1.676 m), weight 74.8 kg, last menstrual period 11/17/2021, SpO2 100 %, not  currently breastfeeding.  PHYSICAL EXAMINATION:  Physical Exam  GENERAL:  36 y.o.-year-old Caucasian female patient lying in the bed with no acute distress.  EYES: Pupils equal, round, reactive to light and accommodation. No scleral icterus. Extraocular muscles intact.  HEENT: Head atraumatic, normocephalic. Oropharynx and nasopharynx clear.  NECK:  Supple, no jugular venous distention. No thyroid enlargement, no tenderness.  LUNGS: Normal breath sounds bilaterally, no wheezing, rales,rhonchi or crepitation. No use of accessory muscles of respiration.  CARDIOVASCULAR: Regular rate and rhythm, S1, S2 normal. No murmurs, rubs, or gallops.  ABDOMEN: Soft, nondistended, nontender. Bowel sounds present. No organomegaly or mass.  EXTREMITIES: No pedal edema, cyanosis, or clubbing.  NEUROLOGIC: Cranial nerves II through XII are intact. Muscle strength 5/5 in all extremities. Sensation intact. Gait not checked.  PSYCHIATRIC: The patient is alert and oriented x 3.  Normal affect and good eye contact. SKIN: No obvious rash, lesion, or ulcer.   LABORATORY PANEL:   CBC Recent Labs  Lab 11/27/21 1024  WBC 4.3  HGB 10.1*  HCT 33.9*  PLT 342   ------------------------------------------------------------------------------------------------------------------  Chemistries  Recent Labs  Lab 11/27/21 1024  NA 137  K <2.0*  CL 105  CO2 23  GLUCOSE 85  BUN <5*  CREATININE 0.55  CALCIUM 8.9  MG 1.8   ------------------------------------------------------------------------------------------------------------------  Cardiac Enzymes No results for input(s): "TROPONINI" in the last 168 hours. ------------------------------------------------------------------------------------------------------------------  RADIOLOGY:  No results found.    IMPRESSION AND PLAN:  Assessment and Plan: * Hypokalemia - The patient will be admitted to a medical telemetry bed. - Potassium will be replaced and  level will be repeated. - She will be hydrated with IV normal saline with added potassium chloride. - We will monitor her for arrhythmias. - Her sodium level and renal functions were unremarkable.  Microcytic anemia - We will check anemia work-up including stool Hemoccult.  Anxiety - We will continue BuSpar. - The patient denied any diarrhea that could have happened from BuSpar since she took it a month ago.    DVT prophylaxis: Lovenox.  We will hold off if stool Hemoccult is positive Advanced Care Planning:  Code Status: full code. Family Communication:  The plan of care was discussed in details with the patient (and family). I answered all questions. The patient agreed to proceed with the above mentioned plan. Further management will depend upon hospital course. Disposition Plan: Back to previous home environment Consults called: none. All the records are reviewed and case discussed with ED provider.  Status is: Observation   I certify that at the time of admission, it is my clinical judgment that the patient will require hospital care extending less than 2 midnights.                            Dispo: The patient is from: Home              Anticipated d/c is to: Home  Patient currently is not medically stable to d/c.              Difficult to place patient: No  Hannah Beat M.D on 11/27/2021 at 2:46 PM  [facility]   From 7 PM-7 AM, contact night-coverage [email or web address]  CC: Primary care physician; Patient, No Pcp Per

## 2021-11-27 NOTE — ED Notes (Signed)
Pt was updated on plan of care and admission, pt became tearful stating "I cant stay that long", MD aware. Pt is agreeable to replacement K+ and repeat blood work.

## 2021-11-27 NOTE — ED Notes (Signed)
First Nurse Note:  Pt presents to the ED for abnormal labs, states that her doctor called her this AM with a low K+, unknown what it was per patient. No labs shown in Epic. Pt is A&OX4 and NAD

## 2021-11-28 DIAGNOSIS — E876 Hypokalemia: Secondary | ICD-10-CM | POA: Diagnosis not present

## 2021-11-28 LAB — BASIC METABOLIC PANEL
Anion gap: 8 (ref 5–15)
Anion gap: 5 (ref 5–15)
BUN: <5 mg/dL — ABNORMAL LOW (ref 6–20)
BUN: <5 mg/dL — ABNORMAL LOW (ref 6–20)
CO2: 19 mmol/L — ABNORMAL LOW (ref 22–32)
CO2: 17 mmol/L — ABNORMAL LOW (ref 22–32)
Calcium: 8.7 mg/dL — ABNORMAL LOW (ref 8.9–10.3)
Calcium: 8.7 mg/dL — ABNORMAL LOW (ref 8.9–10.3)
Chloride: 114 mmol/L — ABNORMAL HIGH (ref 98–111)
Chloride: 116 mmol/L — ABNORMAL HIGH (ref 98–111)
Creatinine, Ser: 0.53 mg/dL (ref 0.44–1.00)
Creatinine, Ser: 0.55 mg/dL (ref 0.44–1.00)
GFR, Estimated: >60 mL/min (ref >60)
GFR, Estimated: >60 mL/min (ref >60)
Glucose, Bld: 72 mg/dL (ref 70–99)
Glucose, Bld: 91 mg/dL (ref 70–99)
Potassium: 2.9 mmol/L — ABNORMAL LOW (ref 3.5–5.1)
Potassium: 3.7 mmol/L (ref 3.5–5.1)
Sodium: 141 mmol/L (ref 135–145)
Sodium: 138 mmol/L (ref 135–145)

## 2021-11-28 LAB — CBC
HCT: 31.5 % — ABNORMAL LOW (ref 36.0–46.0)
Hemoglobin: 9.4 g/dL — ABNORMAL LOW (ref 12.0–15.0)
MCH: 23.5 pg — ABNORMAL LOW (ref 26.0–34.0)
MCHC: 29.8 g/dL — ABNORMAL LOW (ref 30.0–36.0)
MCV: 78.8 fL — ABNORMAL LOW (ref 80.0–100.0)
Platelets: 294 10*3/uL (ref 150–400)
RBC: 4 MIL/uL (ref 3.87–5.11)
RDW: 15.2 % (ref 11.5–15.5)
WBC: 4.7 10*3/uL (ref 4.0–10.5)
nRBC: 0 % (ref 0.0–0.2)

## 2021-11-28 LAB — OCCULT BLOOD X 1 CARD TO LAB, STOOL: Fecal Occult Bld: NEGATIVE

## 2021-11-28 LAB — MAGNESIUM: Magnesium: 2 mg/dL (ref 1.7–2.4)

## 2021-11-28 LAB — PREGNANCY, URINE: Preg Test, Ur: NEGATIVE

## 2021-11-28 MED ORDER — POTASSIUM CHLORIDE 10 MEQ/100ML IV SOLN
10.0000 meq | INTRAVENOUS | Status: AC
  Administered 2021-11-28 (×4): 10 meq via INTRAVENOUS
  Filled 2021-11-28 (×2): qty 100

## 2021-11-28 MED ORDER — POTASSIUM CHLORIDE CRYS ER 20 MEQ PO TBCR
40.0000 meq | EXTENDED_RELEASE_TABLET | Freq: Once | ORAL | Status: AC
  Administered 2021-11-28: 40 meq via ORAL
  Filled 2021-11-28: qty 2

## 2021-11-28 MED ORDER — ADULT MULTIVITAMIN W/MINERALS CH: 1.0000 | ORAL_TABLET | Freq: Every day | ORAL | 0 refills | Status: DC

## 2021-11-28 MED ORDER — MELATONIN 5 MG PO TABS
5.0000 mg | ORAL_TABLET | Freq: Once | ORAL | Status: AC
  Administered 2021-11-28: 5 mg via ORAL
  Filled 2021-11-28: qty 1

## 2021-11-28 NOTE — Significant Event (Signed)
Secure Chat was sent to [facility] regarding K level of 2.9.  Please see below details of the chat message: "Patient's K level is 2.9, patient is asymptomatic and in NSR currently. I have started the continuous NaCl with KCl at 125/hr . This solution was not reported in hand off from the day nurse, and the patient was taking PO K, so I unfortunately did not check if the patient had orders for IV K until this morning after I reviewed the patient's labs. I wanted to make you aware and to determine if any further interventions need to take place at this time."  Providers Response "supplemnt ordered"  Nurse Response "Thank you for ordering the supplement. To confirm, continue the IV fluids NaCl with KCl at 125/hr along with the supplements?"  Provider Response "Yes" "she got twice that yesterday and its still low"  Nurse will hand this off to the day Nurse.

## 2021-11-28 NOTE — Progress Notes (Signed)
MD was informed that pt refused continuous infusion of NS with 20 of K, current K is 3.7. MD was ok and DC the pt. AVS was given to pt and answer all question. Pending boyfriend to pick-up pt.

## 2021-11-28 NOTE — Progress Notes (Signed)
[employer] Data - potassium level remains low 2.9. asymptomatic  Action - 40 meq IV + 10 meq x 4 runs ordered.  Repeat mag level, added to blood in lab  Response - to be followed  Donnie Mesa NP

## 2021-11-28 NOTE — Plan of Care (Signed)

## 2021-11-28 NOTE — Discharge Summary (Signed)
Physician Discharge Summary   Patient: Yvonne Grant MRN: [id number] DOB: 17-May-1985  Admit date:     11/27/2021  Discharge date: 11/28/21  Discharge Physician: Fran Lowes   PCP: Patient, No Pcp Per   Recommendations at discharge:    Discharge to home Eat well rounded meals. Continue to abstain from alcohol.   Discharge Diagnoses: Principal Problem:   Hypokalemia Active Problems:   Anxiety   Microcytic anemia  Hospital Course: The patient was sent to the ED by her PCP when she was found to have a potassium of 1.9. The patient was admitted to a telemetry bed as observation. Her potassium has been supplemented. It is up to 3.7 this afternoon. She will be discharged to home.  Hemoglobin is stable. FOBT was negative.   The patient was continued on Buspar for anxiety.  Assessment and Plan: * Hypokalemia - The patient will be admitted to a medical telemetry bed. - Potassium will be replaced and level will be repeated. - She will be hydrated with IV normal saline with added potassium chloride. - We will monitor her for arrhythmias. - Her sodium level and renal functions were unremarkable.  Microcytic anemia - We will check anemia work-up including stool Hemoccult.  Anxiety - We will continue BuSpar. - The patient denied any diarrhea that could have happened from BuSpar since she took it a month ago.   Consultants: None Procedures performed: None  Disposition: Home Diet recommendation:  Discharge Diet Orders (From admission, onward)     Start     Ordered   11/28/21 0000  Diet general        11/28/21 1407           Regular diet DISCHARGE MEDICATION: Allergies as of 11/28/2021   No Known Allergies      Medication List     TAKE these medications    busPIRone 5 MG tablet Commonly known as: BUSPAR Take 5 mg by mouth 2 (two) times daily.   multivitamin with minerals Tabs tablet Take 1 tablet by mouth daily.        Discharge Exam: Filed Weights    11/27/21 1015 11/27/21 1748  Weight: 74.8 kg 74.8 kg   Exam:  Constitutional:  The patient is awake, alert, and oriented x 3. No acute distress. Respiratory:  No increased work of breathing. No wheezes, rales, or rhonchi No tactile fremitus Cardiovascular:  Regular rate and rhythm No murmurs, ectopy, or gallups. No lateral PMI. No thrills. Abdomen:  Abdomen is soft, non-tender, non-distended No hernias, masses, or organomegaly Normoactive bowel sounds.  Musculoskeletal:  No cyanosis, clubbing, or edema Skin:  No rashes, lesions, ulcers palpation of skin: no induration or nodules Neurologic:  CN 2-12 intact Sensation all 4 extremities intact Psychiatric:  Mental status Mood, affect appropriate Orientation to person, place, time  judgment and insight appear intact   Condition at discharge: good  The results of significant diagnostics from this hospitalization (including imaging, microbiology, ancillary and laboratory) are listed below for reference.   Imaging Studies: No results found.  Microbiology: Results for orders placed or performed during the hospital encounter of 11/30/19  SARS Coronavirus 2 by RT PCR (hospital order, performed in [facility] hospital lab) Nasopharyngeal Nasopharyngeal Swab     Status: None   Collection Time: 11/30/19  3:32 PM   Specimen: Nasopharyngeal Swab  Result Value Ref Range Status   SARS Coronavirus 2 NEGATIVE NEGATIVE Final    Comment: (NOTE) SARS-CoV-2 target nucleic acids are NOT DETECTED.  The SARS-CoV-2 RNA is generally detectable in upper and lower respiratory specimens during the acute phase of infection. The lowest concentration of SARS-CoV-2 viral copies this assay can detect is 250 copies / mL. A negative result does not preclude SARS-CoV-2 infection and should not be used as the sole basis for treatment or other patient management decisions.  A negative result may occur with improper specimen collection / handling,  submission of specimen other than nasopharyngeal swab, presence of viral mutation(s) within the areas targeted by this assay, and inadequate number of viral copies (<250 copies / mL). A negative result must be combined with clinical observations, patient history, and epidemiological information.  Fact Sheet for Patients:   BoilerBrush.com.cy  Fact Sheet for Healthcare Providers: https://pope.com/  This test is not yet approved or  cleared by the [place] FDA and has been authorized for detection and/or diagnosis of SARS-CoV-2 by FDA under an Emergency Use Authorization (EUA).  This EUA will remain in effect (meaning this test can be used) for the duration of the COVID-19 declaration under Section 564(b)(1) of the Act, 21 U.S.C. section 360bbb-3(b)(1), unless the authorization is terminated or revoked sooner.  Performed at [facility], [address], [place], [place] [address]     Labs: CBC: Recent Labs  Lab 11/27/21 1024 11/28/21 0427  WBC 4.3 4.7  NEUTROABS 2.5  --   HGB 10.1* 9.4*  HCT 33.9* 31.5*  MCV 78.5* 78.8*  PLT 342 294   Basic Metabolic Panel: Recent Labs  Lab 11/27/21 1024 11/27/21 1724 11/28/21 0427 11/28/21 1246  NA 137  --  141 138  K <2.0* 2.7* 2.9* 3.7  CL 105  --  114* 116*  CO2 23  --  19* 17*  GLUCOSE 85  --  72 91  BUN <5*  --  <5* <5*  CREATININE 0.55  --  0.53 0.55  CALCIUM 8.9  --  8.7* 8.7*  MG 1.8  --  2.0  --   PHOS 2.7  --   --   --    Liver Function Tests: No results for input(s): "AST", "ALT", "ALKPHOS", "BILITOT", "PROT", "ALBUMIN" in the last 168 hours. CBG: No results for input(s): "GLUCAP" in the last 168 hours.  Discharge time spent: greater than 30 minutes.  Signed: Shanine Kreiger, DO [facility] 11/28/2021

## 2021-11-28 NOTE — Progress Notes (Signed)
Mobility Specialist - Progress Note     11/28/21 1130  Mobility  Activity Ambulated independently in hallway  Level of Assistance Independent  Assistive Device None  Distance Ambulated (ft) 360 ft  Activity Response Tolerated well  $Mobility charge 1 Mobility   Pt sitting up in bed upon entry. Pt utilizing RA. Pt ambulated two laps around NS. Pt left sitting up in bed with needs in reach. No complaints.   Zetta Bills Mobility Specialist 11/28/21 11:32 AM

## 2021-12-17 ENCOUNTER — Encounter: Payer: Self-pay | Admitting: Internal Medicine

## 2021-12-17 ENCOUNTER — Other Ambulatory Visit (INDEPENDENT_AMBULATORY_CARE_PROVIDER_SITE_OTHER): Payer: Medicaid Other

## 2021-12-17 ENCOUNTER — Ambulatory Visit (INDEPENDENT_AMBULATORY_CARE_PROVIDER_SITE_OTHER): Payer: Medicaid Other | Admitting: Internal Medicine

## 2021-12-17 VITALS — BP 106/70 | HR 76 | Ht 67.0 in | Wt 179.5 lb

## 2021-12-17 DIAGNOSIS — R7989 Other specified abnormal findings of blood chemistry: Secondary | ICD-10-CM

## 2021-12-17 DIAGNOSIS — D509 Iron deficiency anemia, unspecified: Secondary | ICD-10-CM

## 2021-12-17 LAB — PROTIME-INR
INR: 1.1 ratio — ABNORMAL HIGH (ref 0.8–1.0)
Prothrombin Time: 11.7 s (ref 9.6–13.1)

## 2021-12-17 LAB — COMPREHENSIVE METABOLIC PANEL
ALT: 11 U/L (ref 0–35)
AST: 16 U/L (ref 0–37)
Albumin: 4 g/dL (ref 3.5–5.2)
Alkaline Phosphatase: 41 U/L (ref 39–117)
BUN: 6 mg/dL (ref 6–23)
CO2: 24 mEq/L (ref 19–32)
Calcium: 9.5 mg/dL (ref 8.4–10.5)
Chloride: 107 mEq/L (ref 96–112)
Creatinine, Ser: 0.49 mg/dL (ref 0.40–1.20)
GFR: 122.25 mL/min (ref >60.00)
Glucose, Bld: 81 mg/dL (ref 70–99)
Potassium: 3.1 mEq/L — ABNORMAL LOW (ref 3.5–5.1)
Sodium: 141 mEq/L (ref 135–145)
Total Bilirubin: 0.4 mg/dL (ref 0.2–1.2)
Total Protein: 7.9 g/dL (ref 6.0–8.3)

## 2021-12-17 LAB — IBC + FERRITIN
Ferritin: 5.8 ng/mL — ABNORMAL LOW (ref 10.0–291.0)
Iron: 18 ug/dL — ABNORMAL LOW (ref 42–145)
Saturation Ratios: 4.4 % — ABNORMAL LOW (ref 20.0–50.0)
TIBC: 407.4 ug/dL (ref 250.0–450.0)
Transferrin: 291 mg/dL (ref 212.0–360.0)

## 2021-12-17 LAB — CBC WITH DIFFERENTIAL/PLATELET
Basophils Absolute: 0 10*3/uL (ref 0.0–0.1)
Basophils Relative: 0.6 % (ref 0.0–3.0)
Eosinophils Absolute: 0.1 10*3/uL (ref 0.0–0.7)
Eosinophils Relative: 2.2 % (ref 0.0–5.0)
HCT: 33 % — ABNORMAL LOW (ref 36.0–46.0)
Hemoglobin: 10.5 g/dL — ABNORMAL LOW (ref 12.0–15.0)
Lymphocytes Relative: 36.4 % (ref 12.0–46.0)
Lymphs Abs: 1.1 10*3/uL (ref 0.7–4.0)
MCHC: 31.7 g/dL (ref 30.0–36.0)
MCV: 76.3 fl — ABNORMAL LOW (ref 78.0–100.0)
Monocytes Absolute: 0.2 10*3/uL (ref 0.1–1.0)
Monocytes Relative: 7.9 % (ref 3.0–12.0)
Neutro Abs: 1.6 10*3/uL (ref 1.4–7.7)
Neutrophils Relative %: 52.9 % (ref 43.0–77.0)
Platelets: 289 10*3/uL (ref 150.0–400.0)
RBC: 4.32 Mil/uL (ref 3.87–5.11)
RDW: 17.8 % — ABNORMAL HIGH (ref 11.5–15.5)
WBC: 3.1 10*3/uL — ABNORMAL LOW (ref 4.0–10.5)

## 2021-12-17 LAB — VITAMIN B12: Vitamin B-12: 181 pg/mL — ABNORMAL LOW (ref 211–911)

## 2021-12-17 LAB — FOLATE: Folate: 15 ng/mL (ref >5.9)

## 2021-12-17 LAB — LIPASE: Lipase: 8 U/L — ABNORMAL LOW (ref 11.0–59.0)

## 2021-12-17 NOTE — Patient Instructions (Signed)
_______________________________________________________  If you are age 35 or older, your body mass index should be between 23-30. Your Body mass index is 28.11 kg/m. If this is out of the aforementioned range listed, please consider follow up with your Primary Care Provider.  If you are age 30 or younger, your body mass index should be between 19-25. Your Body mass index is 28.11 kg/m. If this is out of the aformentioned range listed, please consider follow up with your Primary Care Provider.   Your provider has requested that you go to the basement level for lab work before leaving today. Press "B" on the elevator. The lab is located at the first door on the left as you exit the elevator.  You will be contacted by [facility] Scheduling in the next 2 days to arrange a RUQ Ultrasound.  The number on your caller ID will be [phone], please answer when they call.  If you have not heard from them in 2 days please call [phone] to schedule.     You have been scheduled for an abdominal ultrasound at [facility] (1st floor of hospital) on    at      . Please arrive 15 minutes prior to your appointment for registration. Make certain not to have anything to eat or drink 6 hours prior to your appointment. Should you need to reschedule your appointment, please contact radiology at [phone]. This test typically takes about 30 minutes to perform.  The [facility] providers would like to encourage you to use [facility] to communicate with providers for non-urgent requests or questions.  Due to long hold times on the telephone, sending your provider a message by [facility] may be a faster and more efficient way to get a response.  Please allow 48 business hours for a response.  Please remember that this is for non-urgent requests.   Thank you for entrusting me with your care and for choosing [facility], Dr. Eulah Pont

## 2021-12-17 NOTE — Progress Notes (Signed)
Chief Complaint: Elevated LFTs  HPI : 36 year old female with history of alcohol use, anxiety, and depression presents with elevated LFTs  She went in to establish with a PCP with the [facility] in 10/2021 in order to get started on an anxiety medication. She was found to have elevated LFTs, which were reportedly improving on her most recent lab check.  She stopped drinking alcohol after she was started on an anxiety medication. She was found to have severely low potassium levels. She was having N&V, but her N&V has completely resolved since she stopped drinking and was started on potassium supplements. She completely stopped drinking alcohol starting in 10/2021. Denies blood in stools. She is taking potassium supplements. Denies dysphagia, chest burning, regurgitation, ab pain, diarrhea, and constipation. She has menstrual periods, which are usually not very heavy. She has lost a lot of weight after she stopped drinking alcohol.  Wt Readings from Last 3 Encounters:  12/17/21 179 lb 8 oz (81.4 kg)  11/27/21 164 lb 14.5 oz (74.8 kg)  05/28/21 191 lb 12.8 oz (87 kg)   Past Medical History:  Diagnosis Date   Alcoholism (HCC)    Anemia    Anxiety    Depression    Medical history non-contributory    Obesity    Past Surgical History:  Procedure Laterality Date   COLPOSCOPY  09/26/2019       NO PAST SURGERIES     WISDOM TOOTH EXTRACTION     Family History  Problem Relation Age of Onset   Cervical cancer Mother    Diabetes Maternal Grandmother    Cancer - Cervical Maternal Aunt    Social History   Tobacco Use   Smoking status: Every Day    Types: Cigarettes    Last attempt to quit: 06/10/2018    Years since quitting: 3.5   Smokeless tobacco: Former  Building services engineer Use: Never used  Substance Use Topics   Alcohol use: Not Currently   Drug use: Not Currently    Types: Marijuana    Comment: this morning   Current Outpatient Medications  Medication  Sig Dispense Refill   busPIRone (BUSPAR) 5 MG tablet Take 5 mg by mouth 2 (two) times daily.     potassium chloride (KLOR-CON) 8 MEQ tablet Take 8 mEq by mouth daily.     Potassium Chloride CR (MICRO-K) 8 MEQ CPCR capsule CR Take 8 mEq by mouth.     Multiple Vitamin (MULTIVITAMIN WITH MINERALS) TABS tablet Take 1 tablet by mouth daily. (Patient not taking: Reported on 12/17/2021) 30 tablet 0   No current facility-administered medications for this visit.   No Known Allergies  Review of Systems: All systems reviewed and negative except where noted in HPI.   Physical Exam: BP 106/70   Pulse 76   Ht 5\' 7"  (1.702 m)   Wt 179 lb 8 oz (81.4 kg)   LMP 11/17/2021   BMI 28.11 kg/m  Constitutional: Pleasant,well-developed, female in no acute distress. HEENT: Normocephalic and atraumatic. Conjunctivae are normal. No scleral icterus. Cardiovascular: Normal rate, regular rhythm.  Pulmonary/chest: Effort normal and breath sounds normal. No wheezing, rales or rhonchi. Abdominal: Soft, nondistended, nontender. Bowel sounds active throughout. There are no masses palpable. No hepatomegaly. Extremities: No edema Neurological: Alert and oriented to person place and time. Skin: Skin is warm and dry. No rashes noted. Psychiatric: Normal mood and affect. Behavior is normal.  Labs 03/2008: Nml CMP.   Labs 11/2021:  FOBT negative. BMP with mildly low Ca of 8.7. CBC with low Hb of 9.4. Ferritin low at 7. Folate and vitamin B12 nml.   ASSESSMENT AND PLAN:  Elevated LFTs IDA Patient presents with elevated LFTs that were seen on recent lab checks starting in 10/2021. Unfortunately I do not have her prior LFTs available for review. Thus I will go ahead and recheck her LFTs today and perform a basic liver work up with labs and RUQ U/S for further evaluation. Patient does have substantial alcohol use history so this may have contributed to her previously elevated LFTs. Patient also has history of IDA but is also a  menstruating female so I suspect her anemia is most likely due to menstrual period blood loss. - Check CBC, CMP, INR, ferritin, TTG IgA, IgA, ANA, ASMA, IgG, ceruloplasmin, A1AT, hepatitis A antibody, hepatitis B surface antibody, hepatitis B surface antigen, hepatitis C antibody - Check RUQ U/S  Eulah Pont, MD

## 2021-12-20 LAB — IGG: IgG (Immunoglobin G), Serum: 1678 mg/dL — ABNORMAL HIGH (ref 600–1640)

## 2021-12-20 LAB — TISSUE TRANSGLUTAMINASE, IGA: (tTG) Ab, IgA: <1 U/mL

## 2021-12-20 LAB — ANA: Anti Nuclear Antibody (ANA): POSITIVE — AB

## 2021-12-20 LAB — IGA: Immunoglobulin A: 683 mg/dL — ABNORMAL HIGH (ref 47–310)

## 2021-12-20 LAB — ANTI-NUCLEAR AB-TITER (ANA TITER): ANA Titer 1: 1:1280 {titer} — ABNORMAL HIGH

## 2021-12-20 LAB — HEPATITIS A ANTIBODY, TOTAL: Hepatitis A AB,Total: REACTIVE — AB

## 2021-12-20 LAB — ALPHA-1-ANTITRYPSIN: A-1 Antitrypsin, Ser: 150 mg/dL (ref 83–199)

## 2021-12-20 LAB — ANTI-SMOOTH MUSCLE ANTIBODY, IGG: Actin (Smooth Muscle) Antibody (IGG): <20 U (ref <20)

## 2021-12-20 LAB — HEPATITIS C ANTIBODY: Hepatitis C Ab: NONREACTIVE

## 2021-12-20 LAB — HEPATITIS B SURFACE ANTIGEN: Hepatitis B Surface Ag: NONREACTIVE

## 2021-12-20 LAB — HEPATITIS B SURFACE ANTIBODY,QUALITATIVE: Hep B S Ab: NONREACTIVE

## 2021-12-20 LAB — CERULOPLASMIN: Ceruloplasmin: 28 mg/dL (ref 18–53)

## 2021-12-20 LAB — MITOCHONDRIAL ANTIBODIES: Mitochondrial M2 Ab, IgG: <=20 U (ref <=20.0)

## 2021-12-23 NOTE — Progress Notes (Signed)
Hi Beth, please let Ms. Shnupp know that she is vitamin B12 deficient. She will need to be started on vitamin B12 supplements with cyanocobalamin 1000 mcg IM weekly for 4 weeks, followed by cyanocobalamin shots 1000 mcg IM monthly. Her iron levels were also found to be low so she should start oral ferrous sulfate 325 mg daily. These iron supplements may turn her stools black and cause some constipation. She should take a laxative such as Miralax or senna if she starts to get constipated. Her potassium levels were slightly low but she should continue to take her potassium supplements. Her blood counts show that she is still anemic but that her blood counts are stable relatively stable from prior. Her low red blood cell count may be related to her vitamin B12 and iron deficiency.  For her liver work up labs, her liver enzyme tests have completely normalized, which is good news. This suggests to me that her elevated liver tests were most likely related to her prior alcohol use. She is immune against hepatitis A, but she will still need a hepatitis B vaccine in the future. Two of her labs that evaluate for autoimmune disease were slightly elevated. If her labs ever show that she she has an elevation in her liver tests in the future, we may need to consider doing a liver biopsy at that time. However, since her liver enzyme tests have completely normalized, I do not feel that a liver biopsy is necessary at this time.   Let's get her clinic follow up with me in 3 months to check in.

## 2022-01-01 ENCOUNTER — Ambulatory Visit (HOSPITAL_COMMUNITY)
Admission: RE | Admit: 2022-01-01 | Discharge: 2022-01-01 | Disposition: A | Discharge status: Home / Self Care | Payer: Medicaid Other | Source: Ambulatory Visit | Attending: Internal Medicine | Admitting: Internal Medicine

## 2022-01-01 DIAGNOSIS — R7989 Other specified abnormal findings of blood chemistry: Secondary | ICD-10-CM | POA: Diagnosis present

## 2022-01-01 DIAGNOSIS — D509 Iron deficiency anemia, unspecified: Secondary | ICD-10-CM | POA: Insufficient documentation

## 2022-01-08 ENCOUNTER — Telehealth: Payer: Self-pay | Admitting: Internal Medicine

## 2022-01-08 NOTE — Telephone Encounter (Signed)
Patient called would like to speak with you  regarding B12 injection that you offered her .

## 2022-01-10 NOTE — Telephone Encounter (Signed)
Patient called wanting to follow up on message below. Please advise.

## 2022-01-14 NOTE — Telephone Encounter (Signed)
Left message on machine to call back  

## 2022-01-15 ENCOUNTER — Other Ambulatory Visit: Payer: Self-pay

## 2022-01-15 MED ORDER — CYANOCOBALAMIN 1000 MCG/ML IJ SOLN: INTRAMUSCULAR | 10 refills | Status: DC

## 2022-01-15 NOTE — Telephone Encounter (Signed)
I spoke with the pt and she would like to give herself the recommended B12 injection to her self at home.  I have sent the B12 to the pharmacy with request for syringe and needles to be included.  She tells me that she is very comfortable with doing this herself at home.  She will call back if she has any questions or concerns.  She also states she would prefer to have the Hep B done when she comes in for her 3 month f/u.

## 2022-03-03 ENCOUNTER — Inpatient Hospital Stay
Admission: EM | Admit: 2022-03-03 | Discharge: 2022-03-08 | DRG: 641 | Disposition: A | Discharge status: Home / Self Care | Payer: Medicaid Other | Attending: Family Medicine | Admitting: Family Medicine

## 2022-03-03 ENCOUNTER — Encounter: Payer: Self-pay | Admitting: Emergency Medicine

## 2022-03-03 DIAGNOSIS — Z6824 Body mass index (BMI) 24.0-24.9, adult: Secondary | ICD-10-CM

## 2022-03-03 DIAGNOSIS — E611 Iron deficiency: Secondary | ICD-10-CM | POA: Diagnosis present

## 2022-03-03 DIAGNOSIS — F32A Depression, unspecified: Secondary | ICD-10-CM | POA: Diagnosis present

## 2022-03-03 DIAGNOSIS — E538 Deficiency of other specified B group vitamins: Secondary | ICD-10-CM | POA: Diagnosis present

## 2022-03-03 DIAGNOSIS — E876 Hypokalemia: Principal | ICD-10-CM | POA: Diagnosis present

## 2022-03-03 DIAGNOSIS — Z635 Disruption of family by separation and divorce: Secondary | ICD-10-CM

## 2022-03-03 DIAGNOSIS — Z87891 Personal history of nicotine dependence: Secondary | ICD-10-CM

## 2022-03-03 DIAGNOSIS — D649 Anemia, unspecified: Secondary | ICD-10-CM | POA: Diagnosis present

## 2022-03-03 DIAGNOSIS — Z8049 Family history of malignant neoplasm of other genital organs: Secondary | ICD-10-CM

## 2022-03-03 DIAGNOSIS — M6282 Rhabdomyolysis: Secondary | ICD-10-CM | POA: Diagnosis present

## 2022-03-03 DIAGNOSIS — E46 Unspecified protein-calorie malnutrition: Secondary | ICD-10-CM | POA: Diagnosis present

## 2022-03-03 DIAGNOSIS — Z1152 Encounter for screening for COVID-19: Secondary | ICD-10-CM

## 2022-03-03 DIAGNOSIS — Z79899 Other long term (current) drug therapy: Secondary | ICD-10-CM

## 2022-03-03 DIAGNOSIS — E872 Acidosis, unspecified: Secondary | ICD-10-CM | POA: Diagnosis present

## 2022-03-03 DIAGNOSIS — K7469 Other cirrhosis of liver: Secondary | ICD-10-CM | POA: Diagnosis present

## 2022-03-03 DIAGNOSIS — R7989 Other specified abnormal findings of blood chemistry: Secondary | ICD-10-CM | POA: Diagnosis present

## 2022-03-03 DIAGNOSIS — F1010 Alcohol abuse, uncomplicated: Secondary | ICD-10-CM | POA: Diagnosis present

## 2022-03-03 DIAGNOSIS — Z833 Family history of diabetes mellitus: Secondary | ICD-10-CM

## 2022-03-03 DIAGNOSIS — R945 Abnormal results of liver function studies: Secondary | ICD-10-CM | POA: Insufficient documentation

## 2022-03-03 DIAGNOSIS — F419 Anxiety disorder, unspecified: Secondary | ICD-10-CM | POA: Diagnosis present

## 2022-03-03 LAB — COMPREHENSIVE METABOLIC PANEL
ALT: 109 U/L — ABNORMAL HIGH (ref 0–44)
AST: 274 U/L — ABNORMAL HIGH (ref 15–41)
Albumin: 3.8 g/dL (ref 3.5–5.0)
Alkaline Phosphatase: 67 U/L (ref 38–126)
Anion gap: 10 (ref 5–15)
BUN: 9 mg/dL (ref 6–20)
CO2: 25 mmol/L (ref 22–32)
Calcium: 9.2 mg/dL (ref 8.9–10.3)
Chloride: 104 mmol/L (ref 98–111)
Creatinine, Ser: 0.56 mg/dL (ref 0.44–1.00)
GFR, Estimated: >60 mL/min (ref >60)
Glucose, Bld: 128 mg/dL — ABNORMAL HIGH (ref 70–99)
Potassium: <2 mmol/L — CL (ref 3.5–5.1)
Sodium: 139 mmol/L (ref 135–145)
Total Bilirubin: 0.6 mg/dL (ref 0.3–1.2)
Total Protein: 8.1 g/dL (ref 6.5–8.1)

## 2022-03-03 LAB — CBC
HCT: 38.5 % (ref 36.0–46.0)
Hemoglobin: 12.1 g/dL (ref 12.0–15.0)
MCH: 25.1 pg — ABNORMAL LOW (ref 26.0–34.0)
MCHC: 31.4 g/dL (ref 30.0–36.0)
MCV: 79.7 fL — ABNORMAL LOW (ref 80.0–100.0)
Platelets: 364 10*3/uL (ref 150–400)
RBC: 4.83 MIL/uL (ref 3.87–5.11)
RDW: 17.6 % — ABNORMAL HIGH (ref 11.5–15.5)
WBC: 10.2 10*3/uL (ref 4.0–10.5)
nRBC: 0 % (ref 0.0–0.2)

## 2022-03-03 LAB — RESP PANEL BY RT-PCR (FLU A&B, COVID) ARPGX2
Influenza A by PCR: NEGATIVE
Influenza B by PCR: NEGATIVE
SARS Coronavirus 2 by RT PCR: NEGATIVE

## 2022-03-03 LAB — CK: Total CK: 9338 U/L — ABNORMAL HIGH (ref 38–234)

## 2022-03-03 MED ORDER — POTASSIUM CHLORIDE CRYS ER 20 MEQ PO TBCR
40.0000 meq | EXTENDED_RELEASE_TABLET | Freq: Once | ORAL | Status: AC
  Administered 2022-03-03: 40 meq via ORAL
  Filled 2022-03-03: qty 2

## 2022-03-03 MED ORDER — KETOROLAC TROMETHAMINE 30 MG/ML IJ SOLN
30.0000 mg | Freq: Once | INTRAMUSCULAR | Status: AC
  Administered 2022-03-03: 30 mg via INTRAVENOUS
  Filled 2022-03-03: qty 1

## 2022-03-03 MED ORDER — KETOROLAC TROMETHAMINE 30 MG/ML IJ SOLN: 30.0000 mg | Freq: Once | INTRAMUSCULAR | Status: DC

## 2022-03-03 MED ORDER — POTASSIUM CHLORIDE 10 MEQ/100ML IV SOLN
10.0000 meq | Freq: Once | INTRAVENOUS | Status: AC
  Administered 2022-03-04: 10 meq via INTRAVENOUS
  Filled 2022-03-03: qty 100

## 2022-03-03 MED ORDER — SODIUM CHLORIDE 0.9 % IV SOLN: Freq: Once | INTRAVENOUS | Status: AC

## 2022-03-03 MED ORDER — POTASSIUM CHLORIDE 10 MEQ/100ML IV SOLN
10.0000 meq | Freq: Once | INTRAVENOUS | Status: AC
  Administered 2022-03-03: 10 meq via INTRAVENOUS
  Filled 2022-03-03: qty 100

## 2022-03-03 NOTE — ED Provider Notes (Addendum)
[facility] Provider Note    Event Date/Time   First MD Initiated Contact with Patient 03/03/22 2149     (approximate)   History   Cough   HPI  Yvonne Grant is a 36 y.o. female who presents with complaints of cough, chills for several days, now with significant body aches.  Review of medical records demonstrates patient was admitted on August 16 of this year for significant hypokalemia     Physical Exam   Triage Vital Signs: ED Triage Vitals  Enc Vitals Group     BP 03/03/22 2135 124/76     Pulse Rate 03/03/22 2135 80     Resp 03/03/22 2135 20     Temp 03/03/22 2135 98.5 F (36.9 C)     Temp Source 03/03/22 2135 Oral     SpO2 03/03/22 2119 99 %     Weight 03/03/22 2134 68 kg (150 lb)     Height 03/03/22 2134 1.676 m (5\' 6" )     Head Circumference --      Peak Flow --      Pain Score --      Pain Loc --      Pain Edu? --      Excl. in GC? --     Most recent vital signs: Vitals:   03/03/22 2119 03/03/22 2135  BP:  124/76  Pulse:  80  Resp:  20  Temp:  98.5 F (36.9 C)  SpO2: 99% 98%     General: Awake,  CV:  Good peripheral perfusion.  Resp:  Normal effort.  Abd:  No distention.  Other:     ED Results / Procedures / Treatments   Labs (all labs ordered are listed, but only abnormal results are displayed) Labs Reviewed  CBC - Abnormal; Notable for the following components:      Result Value   MCV 79.7 (*)    MCH 25.1 (*)    RDW 17.6 (*)    All other components within normal limits  RESP PANEL BY RT-PCR (FLU A&B, COVID) [id number]  COMPREHENSIVE METABOLIC PANEL  CK     EKG  ED ECG REPORT I, 03/05/22, the attending physician, personally viewed and interpreted this ECG.  Date: 03/03/2022  Rhythm: normal sinus rhythm QRS Axis: normal Intervals: normal ST/T Wave abnormalities: normal Narrative Interpretation: no evidence of acute ischemia    RADIOLOGY     PROCEDURES:  Critical Care performed:  yes  CRITICAL CARE Performed by: 03/05/2022   Total critical care time: 30 minutes  Critical care time was exclusive of separately billable procedures and treating other patients.  Critical care was necessary to treat or prevent imminent or life-threatening deterioration.  Critical care was time spent personally by me on the following activities: development of treatment plan with patient and/or surrogate as well as nursing, discussions with consultants, evaluation of patient's response to treatment, examination of patient, obtaining history from patient or surrogate, ordering and performing treatments and interventions, ordering and review of laboratory studies, ordering and review of radiographic studies, pulse oximetry and re-evaluation of patient's condition.   Procedures   MEDICATIONS ORDERED IN ED: Medications  potassium chloride SA (KLOR-CON M) CR tablet 40 mEq (has no administration in time range)  potassium chloride 10 mEq in 100 mL IVPB (has no administration in time range)  potassium chloride 10 mEq in 100 mL IVPB (has no administration in time range)  ketorolac (TORADOL) 30 MG/ML injection 30 mg (30  mg Intravenous Given 03/03/22 2250)  0.9 %  sodium chloride infusion ( Intravenous New Bag/Given 03/03/22 2251)     IMPRESSION / MDM / ASSESSMENT AND PLAN / ED COURSE  I reviewed the triage vital signs and the nursing notes. Patient's presentation is most consistent with acute complicated illness / injury requiring diagnostic workup.  Patient presents with viral symptoms however now having significant body aches beyond what I would expect for myalgias from a virus.  Given her history of severe hypokalemia, we will place IV, obtain labs   Notified by lab of critical potassium of less than 2.  Will start p.o. potassium and IV repletion.  We will place the patient on cardiac monitor, I have consult the hospitalist for admission   Hospitalist will admit, has requested RVP  panel to be ordered        FINAL CLINICAL IMPRESSION(S) / ED DIAGNOSES   Final diagnoses:  Hypokalemia     Rx / DC Orders   ED Discharge Orders     None        Note:  This document was prepared using Dragon voice recognition software and may include unintentional dictation errors.   Jene Every, MD 03/03/22 [id number]    Jene Every, MD 03/03/22 [phone]

## 2022-03-03 NOTE — ED Triage Notes (Signed)
Pt presents via EMS with complaints of nasal congestions, cough, and intermittent fevers for the last 4 days. Unsure if she's been around anyone sick. Pt has taken OTC cough medication and naproxen with minimal improvement in sx. Denies CP or SOB.

## 2022-03-03 NOTE — H&P (Signed)
History and Physical    SUMI LYE YIF:[id number] DOB: 1985-09-17 DOA: 03/03/2022  I have briefly reviewed the patient's prior medical records in [facility]  PCP: Patient, No Pcp Per  Patient coming from: home  Chief Complaint: Generalized muscle weakness and cramping  HPI: Yvonne Grant is a 36 y.o. female with medical history significant of anxiety, EtOH use comes to the hospital with complaints of generalized muscle weakness and cramping.  Patient reports that she is recovering from a URI with chest congestion, sore throat, that has been going on for the past 4 days.  She also had few episodes of loose bowel movements that she initially attributed to eating at [facility].  Today she has noticed that she is having significant weakness, barely able to stand up and ambulate, along with diffuse generalized muscle cramping.  She has a history of significant hypokalemia for which she was admitted about 3 months ago, and that was discovered at routine blood work.  She currently denies any fever or chills, no nausea, vomiting, no further diarrhea.  ED Course: In the ED she is afebrile, normotensive, satting well on room air.  Blood work reveals undetectably low potassium less than 2.0.  LFTs are elevated.  She was given IV and p.o. potassium, and we are asked to admit.  Covid/flu negative  Review of Systems: All systems reviewed, and apart from HPI, all negative  Past Medical History:  Diagnosis Date   Alcoholism (HCC)    Anemia    Anxiety    Depression    Medical history non-contributory    Obesity     Past Surgical History:  Procedure Laterality Date   COLPOSCOPY  09/26/2019       NO PAST SURGERIES     WISDOM TOOTH EXTRACTION       reports that she has been smoking cigarettes. She has quit using smokeless tobacco. She reports that she does not currently use alcohol. She reports that she does not currently use drugs after having used the following drugs: Marijuana.  No  Known Allergies  Family History  Problem Relation Age of Onset   Cervical cancer Mother    Diabetes Maternal Grandmother    Cancer - Cervical Maternal Aunt     Prior to Admission medications   Medication Sig Start Date End Date Taking? Authorizing Provider  busPIRone (BUSPAR) 5 MG tablet Take 5 mg by mouth 2 (two) times daily. 11/26/21  Yes [provider]  cyanocobalamin (VITAMIN B12) 1000 MCG/ML injection 1000 mcg weekly for 4 weeks then 1000 mcg monthly 01/15/22  Yes Imogene Burn, MD  Multiple Vitamin (MULTIVITAMIN WITH MINERALS) TABS tablet Take 1 tablet by mouth daily. Patient not taking: Reported on 12/17/2021 11/28/21   Fran Lowes, DO    Physical Exam: Vitals:   03/03/22 2119 03/03/22 2134 03/03/22 2135  BP:   124/76  Pulse:   80  Resp:   20  Temp:   98.5 F (36.9 C)  TempSrc:   Oral  SpO2: 99%  98%  Weight:  68 kg   Height:  5\' 6"  (1.676 m)     Constitutional: NAD, calm, comfortable Eyes: PERRL, lids and conjunctivae normal ENMT: Mucous membranes are moist. Posterior pharynx clear of any exudate or lesions.Normal dentition.  Neck: normal, supple Respiratory: clear to auscultation bilaterally, no wheezing, no crackles. Normal respiratory effort.  Cardiovascular: Regular rate and rhythm, no murmurs / rubs / gallops. No extremity edema. 2+ pedal pulses.  Abdomen: no tenderness,  no masses palpated. Bowel sounds positive.  Musculoskeletal: no clubbing / cyanosis. Normal muscle tone.  Skin: no rashes, lesions, ulcers. No induration Neurologic: Nonfocal.  Generalized weakness present  Labs on Admission: I have personally reviewed following labs and imaging studies  CBC: Recent Labs  Lab 03/03/22 2200  WBC 10.2  HGB 12.1  HCT 38.5  MCV 79.7*  PLT 364   Basic Metabolic Panel: No results for input(s): "NA", "K", "CL", "CO2", "GLUCOSE", "BUN", "CREATININE", "CALCIUM", "MG", "PHOS" in the last 168 hours. Liver Function Tests: No results for input(s): "AST",  "ALT", "ALKPHOS", "BILITOT", "PROT", "ALBUMIN" in the last 168 hours. Coagulation Profile: No results for input(s): "INR", "PROTIME" in the last 168 hours. BNP (last 3 results) No results for input(s): "PROBNP" in the last 8760 hours. CBG: No results for input(s): "GLUCAP" in the last 168 hours. Thyroid Function Tests: No results for input(s): "TSH", "T4TOTAL", "FREET4", "T3FREE", "THYROIDAB" in the last 72 hours. Urine analysis:    Component Value Date/Time   COLORURINE YELLOW 11/08/2019 1220   APPEARANCEUR HAZY (A) 11/08/2019 1220   LABSPEC 1.006 11/08/2019 1220   PHURINE 8.0 11/08/2019 1220   GLUCOSEU NEGATIVE 11/08/2019 1220   HGBUR NEGATIVE 11/08/2019 1220   BILIRUBINUR NEGATIVE 11/08/2019 1220   KETONESUR NEGATIVE 11/08/2019 1220   PROTEINUR NEGATIVE 11/08/2019 1220   UROBILINOGEN 0.2 03/20/2008 1000   NITRITE NEGATIVE 11/08/2019 1220   LEUKOCYTESUR MODERATE (A) 11/08/2019 1220     Radiological Exams on Admission: No results found.  EKG: Independently reviewed.  Sinus rhythm  Assessment/Plan Assessment and Plan: No notes have been filed under this hospital service. Service: Hospitalist   Principal problem Profound hypokalemia-second episode in the last few months.  At this time she is quite symptomatic with generalized muscle weakness.  Received 2 rounds of IV potassium, 40 mEq oral potassium as well.  Add potassium containing fluids, repeat potassium in the morning -Unclear why she is developing these episodes, may have had some GI losses coupled with what looks like his active drinking.  Obtain urinary potassium and creatinine to calculate the ratio. -Obtain urinalysis to evaluate for RTA, although bicarb does not indicate acidosis -Add on magnesium  Active problems Elevated LFTs-in a pattern consistent with EtOH use with AST greater than ALT.  Last drink was earlier today, she had "half a beer".  Not withdrawing right now, but low threshold to add  CIWA  URI-afebrile, seems to be recovering.  COVID and flu all negative.  Had mild GI upset which may have been viral, now resolved.  No nausea, vomiting, diarrhea.  Allow regular diet  Anxiety-continue home medications   DVT prophylaxis: Lovenox Code Status: Full code Family Communication: significant other at bedside Disposition Plan: Home when ready Bed Type: Telemetry Consults called: None Obs/Inp: Observation   Pamella Pert, MD, PhD [facility]  Contact via [email or web address]  03/03/2022, 11:35 PM

## 2022-03-03 NOTE — ED Triage Notes (Signed)
EMS brings pt in for c/o cough x 4 days, muscle cramping

## 2022-03-04 ENCOUNTER — Other Ambulatory Visit: Payer: Self-pay

## 2022-03-04 DIAGNOSIS — Z1152 Encounter for screening for COVID-19: Secondary | ICD-10-CM | POA: Diagnosis not present

## 2022-03-04 DIAGNOSIS — F32A Depression, unspecified: Secondary | ICD-10-CM | POA: Diagnosis present

## 2022-03-04 DIAGNOSIS — K7469 Other cirrhosis of liver: Secondary | ICD-10-CM | POA: Diagnosis present

## 2022-03-04 DIAGNOSIS — E46 Unspecified protein-calorie malnutrition: Secondary | ICD-10-CM | POA: Diagnosis present

## 2022-03-04 DIAGNOSIS — E876 Hypokalemia: Secondary | ICD-10-CM | POA: Diagnosis present

## 2022-03-04 DIAGNOSIS — E611 Iron deficiency: Secondary | ICD-10-CM | POA: Diagnosis present

## 2022-03-04 DIAGNOSIS — Z79899 Other long term (current) drug therapy: Secondary | ICD-10-CM | POA: Diagnosis not present

## 2022-03-04 DIAGNOSIS — R7989 Other specified abnormal findings of blood chemistry: Secondary | ICD-10-CM | POA: Diagnosis present

## 2022-03-04 DIAGNOSIS — F1010 Alcohol abuse, uncomplicated: Secondary | ICD-10-CM | POA: Diagnosis present

## 2022-03-04 DIAGNOSIS — D649 Anemia, unspecified: Secondary | ICD-10-CM | POA: Diagnosis present

## 2022-03-04 DIAGNOSIS — M6282 Rhabdomyolysis: Secondary | ICD-10-CM | POA: Diagnosis present

## 2022-03-04 DIAGNOSIS — Z833 Family history of diabetes mellitus: Secondary | ICD-10-CM | POA: Diagnosis not present

## 2022-03-04 DIAGNOSIS — R945 Abnormal results of liver function studies: Secondary | ICD-10-CM

## 2022-03-04 DIAGNOSIS — Z6824 Body mass index (BMI) 24.0-24.9, adult: Secondary | ICD-10-CM | POA: Diagnosis not present

## 2022-03-04 DIAGNOSIS — Z635 Disruption of family by separation and divorce: Secondary | ICD-10-CM | POA: Diagnosis not present

## 2022-03-04 DIAGNOSIS — Z87891 Personal history of nicotine dependence: Secondary | ICD-10-CM | POA: Diagnosis not present

## 2022-03-04 DIAGNOSIS — E538 Deficiency of other specified B group vitamins: Secondary | ICD-10-CM | POA: Diagnosis present

## 2022-03-04 DIAGNOSIS — F419 Anxiety disorder, unspecified: Secondary | ICD-10-CM | POA: Diagnosis present

## 2022-03-04 DIAGNOSIS — Z8049 Family history of malignant neoplasm of other genital organs: Secondary | ICD-10-CM | POA: Diagnosis not present

## 2022-03-04 DIAGNOSIS — E872 Acidosis, unspecified: Secondary | ICD-10-CM | POA: Diagnosis present

## 2022-03-04 LAB — URINALYSIS, COMPLETE (UACMP) WITH MICROSCOPIC
Bacteria, UA: NONE SEEN
Bilirubin Urine: NEGATIVE
Glucose, UA: NEGATIVE mg/dL
Ketones, ur: NEGATIVE mg/dL
Nitrite: NEGATIVE
Protein, ur: NEGATIVE mg/dL
Specific Gravity, Urine: 1.006 (ref 1.005–1.030)
pH: 8 (ref 5.0–8.0)

## 2022-03-04 LAB — COMPREHENSIVE METABOLIC PANEL
ALT: 103 U/L — ABNORMAL HIGH (ref 0–44)
AST: 259 U/L — ABNORMAL HIGH (ref 15–41)
Albumin: 3.3 g/dL — ABNORMAL LOW (ref 3.5–5.0)
Alkaline Phosphatase: 60 U/L (ref 38–126)
Anion gap: 8 (ref 5–15)
BUN: 9 mg/dL (ref 6–20)
CO2: 20 mmol/L — ABNORMAL LOW (ref 22–32)
Calcium: 8.4 mg/dL — ABNORMAL LOW (ref 8.9–10.3)
Chloride: 113 mmol/L — ABNORMAL HIGH (ref 98–111)
Creatinine, Ser: 0.5 mg/dL (ref 0.44–1.00)
GFR, Estimated: >60 mL/min (ref >60)
Glucose, Bld: 109 mg/dL — ABNORMAL HIGH (ref 70–99)
Potassium: <2 mmol/L — CL (ref 3.5–5.1)
Sodium: 141 mmol/L (ref 135–145)
Total Bilirubin: 0.6 mg/dL (ref 0.3–1.2)
Total Protein: 7.1 g/dL (ref 6.5–8.1)

## 2022-03-04 LAB — URINE DRUG SCREEN, QUALITATIVE (ARMC ONLY)
Amphetamines, Ur Screen: NOT DETECTED
Barbiturates, Ur Screen: NOT DETECTED
Benzodiazepine, Ur Scrn: NOT DETECTED
Cannabinoid 50 Ng, Ur ~~LOC~~: POSITIVE — AB
Cocaine Metabolite,Ur ~~LOC~~: NOT DETECTED
MDMA (Ecstasy)Ur Screen: NOT DETECTED
Methadone Scn, Ur: NOT DETECTED
Opiate, Ur Screen: NOT DETECTED
Phencyclidine (PCP) Ur S: NOT DETECTED
Tricyclic, Ur Screen: NOT DETECTED

## 2022-03-04 LAB — CBC
HCT: 34.1 % — ABNORMAL LOW (ref 36.0–46.0)
Hemoglobin: 10.7 g/dL — ABNORMAL LOW (ref 12.0–15.0)
MCH: 25.2 pg — ABNORMAL LOW (ref 26.0–34.0)
MCHC: 31.4 g/dL (ref 30.0–36.0)
MCV: 80.4 fL (ref 80.0–100.0)
Platelets: 312 10*3/uL (ref 150–400)
RBC: 4.24 MIL/uL (ref 3.87–5.11)
RDW: 17.6 % — ABNORMAL HIGH (ref 11.5–15.5)
WBC: 5.8 10*3/uL (ref 4.0–10.5)
nRBC: 0 % (ref 0.0–0.2)

## 2022-03-04 LAB — CREATININE, URINE, RANDOM: Creatinine, Urine: 49 mg/dL

## 2022-03-04 LAB — NA AND K (SODIUM & POTASSIUM), RAND UR
Potassium Urine: 20 mmol/L
Sodium, Ur: 76 mmol/L

## 2022-03-04 LAB — MAGNESIUM
Magnesium: 2.1 mg/dL (ref 1.7–2.4)
Magnesium: 2.1 mg/dL (ref 1.7–2.4)

## 2022-03-04 LAB — POTASSIUM
Potassium: <2 mmol/L — CL (ref 3.5–5.1)
Potassium: <2 mmol/L — CL (ref 3.5–5.1)
Potassium: <2 mmol/L — CL (ref 3.5–5.1)

## 2022-03-04 MED ORDER — POTASSIUM CHLORIDE 10 MEQ/100ML IV SOLN
10.0000 meq | INTRAVENOUS | Status: AC
  Administered 2022-03-04 (×4): 10 meq via INTRAVENOUS
  Filled 2022-03-04: qty 100

## 2022-03-04 MED ORDER — ONDANSETRON HCL 4 MG PO TABS: 4.0000 mg | ORAL_TABLET | Freq: Four times a day (QID) | ORAL | Status: DC | PRN

## 2022-03-04 MED ORDER — ONDANSETRON HCL 4 MG/2ML IJ SOLN: 4.0000 mg | Freq: Four times a day (QID) | INTRAMUSCULAR | Status: DC | PRN

## 2022-03-04 MED ORDER — ACETAMINOPHEN 325 MG PO TABS
650.0000 mg | ORAL_TABLET | Freq: Four times a day (QID) | ORAL | Status: DC | PRN
  Administered 2022-03-05 – 2022-03-07 (×3): 650 mg via ORAL
  Filled 2022-03-04 (×3): qty 2

## 2022-03-04 MED ORDER — KCL IN DEXTROSE-NACL 40-5-0.45 MEQ/L-%-% IV SOLN
INTRAVENOUS | Status: DC
  Filled 2022-03-04 (×3): qty 1000

## 2022-03-04 MED ORDER — SPIRONOLACTONE 25 MG PO TABS
25.0000 mg | ORAL_TABLET | Freq: Once | ORAL | Status: AC
  Administered 2022-03-04: 25 mg via ORAL
  Filled 2022-03-04: qty 1

## 2022-03-04 MED ORDER — POTASSIUM CHLORIDE CRYS ER 20 MEQ PO TBCR
40.0000 meq | EXTENDED_RELEASE_TABLET | ORAL | Status: AC
  Administered 2022-03-04 (×2): 40 meq via ORAL
  Filled 2022-03-04 (×2): qty 2

## 2022-03-04 MED ORDER — ENOXAPARIN SODIUM 40 MG/0.4ML IJ SOSY
40.0000 mg | PREFILLED_SYRINGE | INTRAMUSCULAR | Status: DC
  Administered 2022-03-04 – 2022-03-08 (×5): 40 mg via SUBCUTANEOUS
  Filled 2022-03-04 (×5): qty 0.4

## 2022-03-04 MED ORDER — LORAZEPAM 1 MG PO TABS
1.0000 mg | ORAL_TABLET | Freq: Four times a day (QID) | ORAL | Status: DC | PRN
  Administered 2022-03-04 – 2022-03-07 (×6): 1 mg via ORAL
  Filled 2022-03-04 (×6): qty 1

## 2022-03-04 MED ORDER — POTASSIUM CHLORIDE CRYS ER 20 MEQ PO TBCR
40.0000 meq | EXTENDED_RELEASE_TABLET | Freq: Once | ORAL | Status: AC
  Administered 2022-03-04: 40 meq via ORAL
  Filled 2022-03-04: qty 2

## 2022-03-04 MED ORDER — POTASSIUM CHLORIDE 10 MEQ/100ML IV SOLN
10.0000 meq | INTRAVENOUS | Status: DC
  Administered 2022-03-04 (×2): 10 meq via INTRAVENOUS
  Filled 2022-03-04: qty 100

## 2022-03-04 MED ORDER — POTASSIUM CHLORIDE 10 MEQ/100ML IV SOLN
10.0000 meq | INTRAVENOUS | Status: AC
  Administered 2022-03-04 – 2022-03-05 (×3): 10 meq via INTRAVENOUS
  Filled 2022-03-04 (×3): qty 100

## 2022-03-04 MED ORDER — ACETAMINOPHEN 650 MG RE SUPP: 650.0000 mg | Freq: Four times a day (QID) | RECTAL | Status: DC | PRN

## 2022-03-04 MED ORDER — BUSPIRONE HCL 10 MG PO TABS
5.0000 mg | ORAL_TABLET | Freq: Two times a day (BID) | ORAL | Status: DC
  Administered 2022-03-04 – 2022-03-08 (×9): 5 mg via ORAL
  Filled 2022-03-04 (×9): qty 1

## 2022-03-04 NOTE — Progress Notes (Signed)
  Progress Note   Patient: Yvonne Grant HFW:[id number] DOB: 1985-06-13 DOA: 03/03/2022     0 DOS: the patient was seen and examined on 03/04/2022   Brief hospital course: Yvonne Grant is a 36 y.o. female with medical history significant of anxiety, EtOH use comes to the hospital with complaints of generalized muscle weakness and cramping.  Lab work showed potassium less than 2.0.  Patient is given IV potassium.  Patient had a similar episode in August 2023.   Assessment and Plan: Recurrent severe hypokalemia. Mild metabolic cirrhosis. Patient did not have any recent diarrhea or nausea vomiting.  Not taking any diuretics.  Etiology of hypokalemia is unclear.  Will obtain urine tox screen given patient denies any drug use.  She drinks alcohol 1-2 beers a day, probably not the source. This is the second time patient has severe hypokalemia in this year.  Will obtain nephrology consult to help with identifying the cause. Patient received IV potassium overnight, also started on 40 mg of KCl and 1 L fluid infusion, potassium this morning is still less than 2.0.  I ordered another 40 mEq of KCl in addition to the infusion above. Patient has mild metabolic acidosis, will give sodium bicarb when potassium is better. Continue monitor patient in cardiac unit with telemetry monitoring.  Nontraumatic rhabdomyolysis. Reviewed patient medication list, patient is taking antidepressants, but she does not have any neurological complaints.  There is reports of hypokalemia induced rhabdomyolysis.  Probably this is the source. We will continue monitoring with the fluids infusion.  Check a CK level tomorrow.  Liver function abnormality. Patient has elevated AST/ALT, bilirubin not elevated.  We will check acute hepatitis panel.  Anxiety.   Patient is on buspirone, added as needed Ativan.      Subjective:  Patient still complaining of weakness and anxiety. No abdominal pain or nausea  vomiting.  Physical Exam: Vitals:   03/04/22 0530 03/04/22 0700 03/04/22 0800 03/04/22 0903  BP: 121/77   132/77  Pulse: (!) 52 69 69 75  Resp: 18  19 16   Temp: 98.5 F (36.9 C)   98.3 F (36.8 C)  TempSrc: Oral   Oral  SpO2: 100%  99% 99%  Weight:      Height:       General exam: Appears calm and comfortable  Respiratory system: Clear to auscultation. Respiratory effort normal. Cardiovascular system: S1 & S2 heard, RRR. No JVD, murmurs, rubs, gallops or clicks. No pedal edema. Gastrointestinal system: Abdomen is nondistended, soft and nontender. No organomegaly or masses felt. Normal bowel sounds heard. Central nervous system: Alert and oriented. No focal neurological deficits. Extremities: Symmetric 5 x 5 power. Skin: No rashes, lesions or ulcers Psychiatry: Judgement and insight appear normal. Mood & affect appropriate.   Data Reviewed:  Reviewed all lab results.  Family Communication:   Disposition: Status is: Inpatient Remains inpatient appropriate because: Severity of disease, life-threatening hypokalemia.  Planned Discharge Destination: Home    Time spent: 55 minutes  Author: , MD 03/04/2022 12:34 PM  For on call review [email or web address].

## 2022-03-04 NOTE — Consult Note (Signed)
PHARMACY CONSULT NOTE  Pharmacy Consult for Electrolyte Monitoring and Replacement   Recent Labs: Potassium (mmol/L)  Date Value  03/04/2022 <2.0 (LL)   Magnesium (mg/dL)  Date Value  71/09/2692 2.1   Calcium (mg/dL)  Date Value  85/46/2703 8.4 (L)   Albumin (g/dL)  Date Value  50/12/3816 3.3 (L)   Phosphorus (mg/dL)  Date Value  29/93/7169 2.7   Sodium (mmol/L)  Date Value  03/04/2022 141     Assessment: 36 yo female presented to the ED with complaint of generalized muscle weakness and cramping.  Patient was found to have an undetectable potassium.  Most recent K+ 11/21 @ 1708 continues to be undetectable.  In the last 24 hours patient as received 80 meq Kcl IV and 120 meq Kcl po for a total of 200 meq.  Goal of Therapy:  WNL  Plan:  Will give Kcl 40 meq IV x 1 Will give Kcl 40 meq po every 2 hours x 2 Recheck potassium 1 hour after last dose is given.  Barrie Folk ,PharmD Clinical Pharmacist 03/04/2022 5:48 PM

## 2022-03-04 NOTE — Progress Notes (Signed)
       CROSS COVER NOTE  NAME: Yvonne Grant MRN: [id number] DOB : 1986-02-23 ATTENDING PHYSICIAN: Leatha Gilding, MD    Date of Service   03/04/2022   HPI/Events of Note   Notified of critical K less than 2.0. At this time continuous IVF with K have not been started.  Interventions   Assessment/Plan:  Start D51/2NS with K ordered at 2335 40 mEQ PO K 20 mEQ IV K      This document was prepared using Dragon voice recognition software and may include unintentional dictation errors.  Bishop Limbo DNP, MBA, FNP-BC Nurse Practitioner [facility] Pager [phone]

## 2022-03-04 NOTE — Hospital Course (Signed)
Yvonne Grant is a 36 y.o. female with medical history significant of anxiety, EtOH use comes to the hospital with complaints of generalized muscle weakness and cramping.  Lab work showed potassium less than 2.0.  Patient is given IV potassium.  Patient had a similar episode in August 2023.  Patient has been receiving massive amount of IV potassium, potassium level still less than 2.  However, leg weakness much improved.  Patient was able to walk better. Random urine potassium level is 17, appear high with associated severe hypokalemia, patient seems to have renal wasting.  Patient is followed by nephrology.

## 2022-03-04 NOTE — Consult Note (Signed)
PHARMACY CONSULT NOTE  Pharmacy Consult for Electrolyte Monitoring and Replacement   Recent Labs: Potassium (mmol/L)  Date Value  03/04/2022 <2.0 (LL)   Magnesium (mg/dL)  Date Value  71/09/2692 2.1   Calcium (mg/dL)  Date Value  85/46/2703 8.4 (L)   Albumin (g/dL)  Date Value  50/12/3816 3.3 (L)   Phosphorus (mg/dL)  Date Value  29/93/7169 2.7   Sodium (mmol/L)  Date Value  03/04/2022 141     Assessment: 36 yo female presented to the ED with complaint of generalized muscle weakness and cramping.  Patient was found to have an undetectable potassium.  Most recent K+ 11/21 @ 1222 continues to be undetectable after total of 140 meq of Kcl from IV and PO combined.  Patient has 20 meq Kcl IV left to be given.  Goal of Therapy:  WNL  Plan:  Will give Kcl 40 meq po x 1, then recheck level 11/21 @ 1630.  Barrie Folk ,PharmD Clinical Pharmacist 03/04/2022 2:22 PM

## 2022-03-04 NOTE — Progress Notes (Signed)
Potassium still less than 2. Patient seems to have a huge potassium deficit. Discussed with nephrology. Discussed with pharmacist and night hospitalist coverage. Give aldactone. Continue replete potassium, recheck mag level. Make sure k level is checked again 1 hour after completion of of iv kcl.

## 2022-03-04 NOTE — Consult Note (Signed)
[facility]  CONSULT NOTE    Date: 03/04/2022                  Patient Name:  Yvonne Grant  MRN: [id number]  DOB: 11/18/1985  Age / Sex: 36 y.o., female         PCP: Patient, No Pcp Per                 Service Requesting Consult: TRH                 Reason for Consult: Hypokalemia            History of Present Illness: Yvonne Grant is a 36 y.o.  female with past medical history significant for anxiety and alcohol abuse, who [facility]/2022 for Hypokalemia [E87.6]  Patient presents to the emergency department with complaints of generalized weakness and cramping.  Patient is seen sitting at side of bed with boyfriend at bedside.  Currently on room air with no lower extremity edema.  Patient appears very anxious and stating she is overwhelmed with being told multiple different diagnoses and possible treatments.  Patient states she recently developed loose stools however only 1-2 stools per day.  Patient is open and upfront with past alcohol use.  Becomes very anxious when discussing, boyfriend states patient will consume 4 to 5-40 ounce beers a day before entering treatment.  Patient states she did consume more alcohol than meals.  Patient was able to maintain sobriety until maybe a month ago.  Patient now drinks 2 to 3-22 ounce cans of margaritas a day.  Once again states she drinks more than she eats.  States her alcohol use increases with stress and she has allowed it to return after being sober.  States her main complaint at this time is weakness, preventing her from eating meals.  She states she urinates every 30 minutes to 1 hour.  Denies shortness of breath.  Denies abdominal pain.  States she feels a chest tightness however relates this to her anxiety.  Labs on ED arrival significant for potassium less than 2.0, AST 274 ALT 109, CK greater than 9300.  Respiratory panel negative for influenza and COVID-19.  UA appears clear with some  hematuria.  Medications: Outpatient medications: (Not in a hospital admission)   Current medications: Current Facility-Administered Medications  Medication Dose Route Frequency Provider Last Rate Last Admin   acetaminophen (TYLENOL) tablet 650 mg  650 mg Oral Q6H PRN Leatha GildingGherghe, Costin M, MD       Or   acetaminophen (TYLENOL) suppository 650 mg  650 mg Rectal Q6H PRN Leatha GildingGherghe, Costin M, MD       busPIRone (BUSPAR) tablet 5 mg  5 mg Oral BID Pamella PertGherghe, Costin M, MD   5 mg at 03/04/22 1053   enoxaparin (LOVENOX) injection 40 mg  40 mg Subcutaneous Q24H Pamella PertGherghe, Costin M, MD   40 mg at 03/04/22 0902   LORazepam (ATIVAN) tablet 1 mg  1 mg Oral Q6H PRN Marrion CoyZhang, Dekui, MD   1 mg at 03/04/22 1703   ondansetron (ZOFRAN) tablet 4 mg  4 mg Oral Q6H PRN Leatha GildingGherghe, Costin M, MD       Or   ondansetron (ZOFRAN) injection 4 mg  4 mg Intravenous Q6H PRN Leatha GildingGherghe, Costin M, MD       potassium chloride 10 mEq in 100 mL IVPB  10 mEq Intravenous Q1 Hr x 4 Barrie FolkGreenwood, Howard F, [facility]  potassium chloride SA (KLOR-CON M) CR tablet 40 mEq  40 mEq Oral Q2H Barrie Folk, [facility]       Current Outpatient Medications  Medication Sig Dispense Refill   busPIRone (BUSPAR) 5 MG tablet Take 5 mg by mouth 2 (two) times daily.     cyanocobalamin (VITAMIN B12) 1000 MCG/ML injection 1000 mcg weekly for 4 weeks then 1000 mcg monthly 1 mL 10   Multiple Vitamin (MULTIVITAMIN WITH MINERALS) TABS tablet Take 1 tablet by mouth daily. (Patient not taking: Reported on 12/17/2021) 30 tablet 0      Allergies: No Known Allergies    Past Medical History: Past Medical History:  Diagnosis Date   Alcoholism (HCC)    Anemia    Anxiety    Depression    Medical history non-contributory    Obesity      Past Surgical History: Past Surgical History:  Procedure Laterality Date   COLPOSCOPY  09/26/2019       NO PAST SURGERIES     WISDOM TOOTH EXTRACTION       Family History: Family History  Problem Relation Age of Onset    Cervical cancer Mother    Diabetes Maternal Grandmother    Cancer - Cervical Maternal Aunt      Social History: Social History   Socioeconomic History   Marital status: Legally Separated    Spouse name: Not on file   Number of children: Not on file   Years of education: Not on file   Highest education level: Not on file  Occupational History   Not on file  Tobacco Use   Smoking status: Every Day    Types: Cigarettes    Last attempt to quit: 06/10/2018    Years since quitting: 3.7   Smokeless tobacco: Former  Building services engineer Use: Never used  Substance and Sexual Activity   Alcohol use: Not Currently   Drug use: Not Currently    Types: Marijuana    Comment: this morning   Sexual activity: Yes    Birth control/protection: None  Other Topics Concern   Not on file  Social History Narrative   Not on file   Social Determinants of Health   Financial Resource Strain: Not on file  Food Insecurity: No Food Insecurity (03/04/2022)   Hunger Vital Sign    Worried About Running Out of Food in the Last Year: Never true    Ran Out of Food in the Last Year: Never true  Transportation Needs: No Transportation Needs (03/04/2022)   PRAPARE - Administrator, Civil Service (Medical): No    Lack of Transportation (Non-Medical): No  Physical Activity: Not on file  Stress: Not on file  Social Connections: Not on file  Intimate Partner Violence: Not At Risk (03/04/2022)   Humiliation, Afraid, Rape, and Kick questionnaire    Fear of Current or Ex-Partner: No    Emotionally Abused: No    Physically Abused: No    Sexually Abused: No     Review of Systems: Review of Systems  Constitutional:  Negative for chills, fever and malaise/fatigue.  HENT:  Negative for congestion, sore throat and tinnitus.   Eyes:  Negative for blurred vision and redness.  Respiratory:  Negative for cough, shortness of breath and wheezing.   Cardiovascular:  Negative for chest pain,  palpitations, claudication and leg swelling.  Gastrointestinal:  Negative for abdominal pain, blood in stool, diarrhea, nausea and vomiting.  Genitourinary:  Negative for flank pain,  frequency and hematuria.  Musculoskeletal:  Negative for back pain, falls and myalgias.  Skin:  Negative for rash.  Neurological:  Positive for weakness. Negative for dizziness and headaches.  Endo/Heme/Allergies:  Does not bruise/bleed easily.  Psychiatric/Behavioral:  Negative for depression. The patient is not nervous/anxious and does not have insomnia.     Vital Signs: Blood pressure (!) 132/103, pulse 69, temperature 98 F (36.7 C), temperature source Oral, resp. rate 18, height 5\' 6"  (1.676 m), weight 68 kg, SpO2 100 %.  Weight trends: Filed Weights   03/03/22 2134  Weight: 68 kg    Physical Exam: General: NAD, anxious, restless  Head: Normocephalic, atraumatic. Moist oral mucosal membranes  Eyes: Anicteric  Neck: Supple  Lungs:  Clear to auscultation, normal effort, room air  Heart: Regular rate and rhythm  Abdomen:  Soft, nontender, nondistended  Extremities: No peripheral edema.  Neurologic: Nonfocal, moving all four extremities  Skin: No lesions  Access: None     Lab results: Basic Metabolic Panel: Recent Labs  Lab 03/03/22 2200 03/04/22 0410 03/04/22 0612 03/04/22 1222 03/04/22 1708  NA 139  --  141  --   --   K <2.0*   < > <2.0* <2.0* <2.0*  CL 104  --  113*  --   --   CO2 25  --  20*  --   --   GLUCOSE 128*  --  109*  --   --   BUN 9  --  9  --   --   CREATININE 0.56  --  0.50  --   --   CALCIUM 9.2  --  8.4*  --   --   MG 2.1  --   --   --   --    < > = values in this interval not displayed.    Liver Function Tests: Recent Labs  Lab 03/03/22 2200 03/04/22 0612  AST 274* 259*  ALT 109* 103*  ALKPHOS 67 60  BILITOT 0.6 0.6  PROT 8.1 7.1  ALBUMIN 3.8 3.3*   No results for input(s): "LIPASE", "AMYLASE" in the last 168 hours. No results for input(s): "AMMONIA"  in the last 168 hours.  CBC: Recent Labs  Lab 03/03/22 2200 03/04/22 0612  WBC 10.2 5.8  HGB 12.1 10.7*  HCT 38.5 34.1*  MCV 79.7* 80.4  PLT 364 312    Cardiac Enzymes: Recent Labs  Lab 03/03/22 2200  CKTOTAL 9,338*    BNP: Invalid input(s): "POCBNP"  CBG: No results for input(s): "GLUCAP" in the last 168 hours.  Microbiology: Results for orders placed or performed during the hospital encounter of 03/03/22  Resp Panel by RT-PCR (Flu A&B, Covid) Anterior Nasal Swab     Status: None   Collection Time: 03/03/22  9:38 PM   Specimen: Anterior Nasal Swab  Result Value Ref Range Status   SARS Coronavirus 2 by RT PCR NEGATIVE NEGATIVE Final    Comment: (NOTE) SARS-CoV-2 target nucleic acids are NOT DETECTED.  The SARS-CoV-2 RNA is generally detectable in upper respiratory specimens during the acute phase of infection. The lowest concentration of SARS-CoV-2 viral copies this assay can detect is 138 copies/mL. A negative result does not preclude SARS-Cov-2 infection and should not be used as the sole basis for treatment or other patient management decisions. A negative result may occur with  improper specimen collection/handling, submission of specimen other than nasopharyngeal swab, presence of viral mutation(s) within the areas targeted by this assay, and inadequate number of  viral copies(<138 copies/mL). A negative result must be combined with clinical observations, patient history, and epidemiological information. The expected result is Negative.  Fact Sheet for Patients:  BloggerCourse.com  Fact Sheet for Healthcare Providers:  SeriousBroker.it  This test is no t yet approved or cleared by the [place] FDA and  has been authorized for detection and/or diagnosis of SARS-CoV-2 by FDA under an Emergency Use Authorization (EUA). This EUA will remain  in effect (meaning this test can be used) for the duration of  the COVID-19 declaration under Section 564(b)(1) of the Act, 21 U.S.C.section 360bbb-3(b)(1), unless the authorization is terminated  or revoked sooner.       Influenza A by PCR NEGATIVE NEGATIVE Final   Influenza B by PCR NEGATIVE NEGATIVE Final    Comment: (NOTE) The Xpert Xpress SARS-CoV-2/FLU/RSV plus assay is intended as an aid in the diagnosis of influenza from Nasopharyngeal swab specimens and should not be used as a sole basis for treatment. Nasal washings and aspirates are unacceptable for Xpert Xpress SARS-CoV-2/FLU/RSV testing.  Fact Sheet for Patients: BloggerCourse.com  Fact Sheet for Healthcare Providers: SeriousBroker.it  This test is not yet approved or cleared by the [place] FDA and has been authorized for detection and/or diagnosis of SARS-CoV-2 by FDA under an Emergency Use Authorization (EUA). This EUA will remain in effect (meaning this test can be used) for the duration of the COVID-19 declaration under Section 564(b)(1) of the Act, 21 U.S.C. section 360bbb-3(b)(1), unless the authorization is terminated or revoked.  Performed at [facility], [address] [place], [place] [address]     Coagulation Studies: No results for input(s): "LABPROT", "INR" in the last 72 hours.  Urinalysis: Recent Labs    03/04/22 0409  COLORURINE YELLOW*  LABSPEC 1.006  PHURINE 8.0  GLUCOSEU NEGATIVE  HGBUR MODERATE*  BILIRUBINUR NEGATIVE  KETONESUR NEGATIVE  PROTEINUR NEGATIVE  NITRITE NEGATIVE  LEUKOCYTESUR SMALL*      Imaging: No results found.   Assessment & Plan: Ms. SARON VANORMAN is a 36 y.o.  female with past medical history significant for anxiety and alcohol abuse, who was admitted to [facility] on 03/03/2022 for Hypokalemia [E87.6]  Hypokalemia likely secondary to malnutrition from excessive alcohol use.  Differential diagnosis of diarrhea however limited history.  Potassium has  remained less than 2.0 since admission last night.  Despite IV supplementation and IV fluids containing potassium chloride.  Recommend continuing IV supplementation of potassium 80 mill equivalents or more per day.  Patient encouraged to increase oral intake and discontinue alcohol use.  Expect these levels to take 2 to 3 days to correct.  Discussed with patient and family that symptoms including weakness will subside once levels repleted.   2.  Acute metabolic acidosis, mild.  Serum bicarb 20.  We will continue to monitor.  3.  Rhabdomyolysis, CK greater than 9300.  Believed due to lack of potassium and muscle breakdown.  Renal function currently stable.  Continue IV fluids.  We will continue to monitor.   LOS: 0 Sanyiah Kanzler 11/21/20236:10 PM

## 2022-03-05 DIAGNOSIS — M6282 Rhabdomyolysis: Secondary | ICD-10-CM | POA: Diagnosis not present

## 2022-03-05 DIAGNOSIS — E876 Hypokalemia: Secondary | ICD-10-CM | POA: Diagnosis not present

## 2022-03-05 DIAGNOSIS — R945 Abnormal results of liver function studies: Secondary | ICD-10-CM | POA: Diagnosis not present

## 2022-03-05 LAB — CBC
HCT: 29.9 % — ABNORMAL LOW (ref 36.0–46.0)
Hemoglobin: 9.6 g/dL — ABNORMAL LOW (ref 12.0–15.0)
MCH: 25.6 pg — ABNORMAL LOW (ref 26.0–34.0)
MCHC: 32.1 g/dL (ref 30.0–36.0)
MCV: 79.7 fL — ABNORMAL LOW (ref 80.0–100.0)
Platelets: 263 10*3/uL (ref 150–400)
RBC: 3.75 MIL/uL — ABNORMAL LOW (ref 3.87–5.11)
RDW: 17.8 % — ABNORMAL HIGH (ref 11.5–15.5)
WBC: 5.4 10*3/uL (ref 4.0–10.5)
nRBC: 0 % (ref 0.0–0.2)

## 2022-03-05 LAB — POTASSIUM
Potassium: <2 mmol/L — CL (ref 3.5–5.1)
Potassium: 2.1 mmol/L — CL (ref 3.5–5.1)
Potassium: 3.3 mmol/L — ABNORMAL LOW (ref 3.5–5.1)

## 2022-03-05 LAB — BASIC METABOLIC PANEL
Anion gap: 4 — ABNORMAL LOW (ref 5–15)
BUN: 5 mg/dL — ABNORMAL LOW (ref 6–20)
CO2: 22 mmol/L (ref 22–32)
Calcium: 7.8 mg/dL — ABNORMAL LOW (ref 8.9–10.3)
Chloride: 115 mmol/L — ABNORMAL HIGH (ref 98–111)
Creatinine, Ser: 0.41 mg/dL — ABNORMAL LOW (ref 0.44–1.00)
GFR, Estimated: >60 mL/min (ref >60)
Glucose, Bld: 98 mg/dL (ref 70–99)
Potassium: <2 mmol/L — CL (ref 3.5–5.1)
Sodium: 141 mmol/L (ref 135–145)

## 2022-03-05 LAB — IRON AND TIBC
Iron: <5 ug/dL — ABNORMAL LOW (ref 28–170)
TIBC: 407 ug/dL (ref 250–450)

## 2022-03-05 LAB — FERRITIN: Ferritin: 9 ng/mL — ABNORMAL LOW (ref 11–307)

## 2022-03-05 LAB — HEPATITIS PANEL, ACUTE
HCV Ab: NONREACTIVE
Hep A IgM: NONREACTIVE
Hep B C IgM: NONREACTIVE
Hepatitis B Surface Ag: NONREACTIVE

## 2022-03-05 LAB — CK: Total CK: 13040 U/L — ABNORMAL HIGH (ref 38–234)

## 2022-03-05 LAB — NA AND K (SODIUM & POTASSIUM), RAND UR
Potassium Urine: 17 mmol/L
Sodium, Ur: 116 mmol/L

## 2022-03-05 LAB — MAGNESIUM
Magnesium: 2 mg/dL (ref 1.7–2.4)
Magnesium: 2.1 mg/dL (ref 1.7–2.4)

## 2022-03-05 LAB — VITAMIN B12: Vitamin B-12: 130 pg/mL — ABNORMAL LOW (ref 180–914)

## 2022-03-05 MED ORDER — POTASSIUM CHLORIDE 10 MEQ/100ML IV SOLN
10.0000 meq | INTRAVENOUS | Status: AC
  Administered 2022-03-05 (×2): 10 meq via INTRAVENOUS
  Filled 2022-03-05 (×4): qty 100

## 2022-03-05 MED ORDER — POTASSIUM CHLORIDE CRYS ER 20 MEQ PO TBCR
40.0000 meq | EXTENDED_RELEASE_TABLET | Freq: Once | ORAL | Status: AC
  Administered 2022-03-05: 40 meq via ORAL
  Filled 2022-03-05: qty 2

## 2022-03-05 MED ORDER — SPIRONOLACTONE 25 MG PO TABS
50.0000 mg | ORAL_TABLET | Freq: Two times a day (BID) | ORAL | Status: DC
  Administered 2022-03-05 – 2022-03-08 (×7): 50 mg via ORAL
  Filled 2022-03-05 (×7): qty 2

## 2022-03-05 MED ORDER — POTASSIUM CHLORIDE 10 MEQ/100ML IV SOLN
10.0000 meq | INTRAVENOUS | Status: AC
  Administered 2022-03-05 – 2022-03-06 (×4): 10 meq via INTRAVENOUS
  Filled 2022-03-05 (×4): qty 100

## 2022-03-05 MED ORDER — POTASSIUM CHLORIDE 10 MEQ/100ML IV SOLN
10.0000 meq | INTRAVENOUS | Status: AC
  Administered 2022-03-05 (×2): 10 meq via INTRAVENOUS
  Filled 2022-03-05 (×2): qty 100

## 2022-03-05 MED ORDER — SODIUM CHLORIDE 0.45 % IV SOLN
INTRAVENOUS | Status: DC
  Filled 2022-03-05 (×9): qty 1000

## 2022-03-05 MED ORDER — POTASSIUM CHLORIDE 10 MEQ/100ML IV SOLN
10.0000 meq | INTRAVENOUS | Status: AC
  Administered 2022-03-05 (×4): 10 meq via INTRAVENOUS
  Filled 2022-03-05 (×5): qty 100

## 2022-03-05 MED ORDER — SODIUM CHLORIDE 0.9 % IV SOLN: INTRAVENOUS | Status: DC | PRN

## 2022-03-05 NOTE — Consult Note (Signed)
PHARMACY CONSULT NOTE  Pharmacy Consult for Electrolyte Monitoring and Replacement   Recent Labs: Potassium (mmol/L)  Date Value  03/05/2022 <2.0 (LL)   Magnesium (mg/dL)  Date Value  16/96/7893 2.1   Calcium (mg/dL)  Date Value  81/04/7508 7.8 (L)   Albumin (g/dL)  Date Value  25/85/2778 3.3 (L)   Phosphorus (mg/dL)  Date Value  24/23/5361 2.7   Sodium (mmol/L)  Date Value  03/05/2022 141     Assessment: 36 yo female presented to the ED with complaint of generalized muscle weakness and cramping.  Patient was found to have an undetectable potassium.  Last 24 hr: Kcl PO 160 mEq + Kcl IV 110 mEq.   Goal of Therapy:  WNL  Plan:  Starting pt on 1/2NS with Kcl 60 mEq @ 125 ml/hr. (Max rate 160 ml/hr). Started spironolactone 50 mg BID. Will give KCL 10 mEq x 2 to complete order for Kcl 10 mEq IV x 4 (only 2 doses were given). Also will add on Kcl 40 mEq PO x 1.   Ronnald Ramp ,PharmD Clinical Pharmacist 03/05/2022 8:37 AM

## 2022-03-05 NOTE — Progress Notes (Signed)
Patient declined bed alarm

## 2022-03-05 NOTE — Progress Notes (Addendum)
Progress Note   Patient: Yvonne Grant MLY:[id number] DOB: 04-29-1985 DOA: 03/03/2022     1 DOS: the patient was seen and examined on 03/05/2022   Brief hospital course: Yvonne Grant is a 36 y.o. female with medical history significant of anxiety, EtOH use comes to the hospital with complaints of generalized muscle weakness and cramping.  Lab work showed potassium less than 2.0.  Patient is given IV potassium.  Patient had a similar episode in August 2023.  Patient has been receiving massive amount of IV potassium, potassium level still less than 2.  However, leg weakness much improved.  Patient was able to walk better. Random urine potassium level is not high, patient does not seem to have any renal wasting.  Patient is followed by nephrology.  Assessment and Plan: Recurrent severe hypokalemia. Mild metabolic acidosis. Patient did not have any recent diarrhea or nausea vomiting.  Not taking any diuretics.  Urine drug screen only positive for marijuana.  She drinks alcohol 1-2 beers a day, probably not the source. This is the second time patient has severe hypokalemia in this year.  Has been receiving massive amount of IV as well as oral potassium, potassium level still low.  Aldactone started at 50 mg twice a day.  Aldosterone/renin ratio is also obtained.  Unlikely to be positive as patient random urine potassium level is not elevated. Patient potassium level still less than 2 after 2 days of IV potassium infusion.  However, patient seemed to be better physically.  At the time of admission, patient could barely stand up, requiring 2 assistance.  Currently, she is able to walk by herself with minimal assistance.  Patient seems to have severe potassium deficit developed over a long period of time.  May take several days to normalize potassium level. For now, continue to monitor patient closely in telemetry unit.  Pharmacy will help with monitoring and dosing of potassium.   Nontraumatic  rhabdomyolysis. Reviewed patient medication list, patient is taking antidepressants, but she does not have any neurological complaints.  There is reports of hypokalemia induced rhabdomyolysis.  Probably this is the source. Continue fluids with potassium, monitor CK level.   Liver function abnormality. Patient has elevated AST/ALT, bilirubin not elevated.  This also could be secondary to severe hypokalemia.  Acute hepatitis panel negative.   Anxiety.   Patient is on buspirone, added as needed Ativan.  Addendum: 1345.  Performed literature search:  Spot urine potassium at 27mmol/L is considered high with severe hypokalemia. Urine potassium can be 0 under such condition.  Patient has potassium wasting from kidney, pending Aldosterone/renin ratio. Continue aggressive kcl infusion and aldactone.     Subjective:  Patient feels much stronger since admission to the hospital.  She now can walk independently.  She does not have short of breath or cough.  No diarrhea nausea vomiting, tolerating diet.  Physical Exam: Vitals:   03/05/22 0102 03/05/22 0407 03/05/22 0839 03/05/22 1157  BP: 120/77 113/60 (!) 138/94 133/88  Pulse: 65 79 86 76  Resp: 16 18 18 18   Temp: 98.4 F (36.9 C) 98.1 F (36.7 C) 98 F (36.7 C) 97.9 F (36.6 C)  TempSrc:  Oral Oral   SpO2: 100% 100% 98% 100%  Weight: 72.8 kg     Height: 5\' 6"  (1.676 m)      General exam: Appears calm and comfortable  Respiratory system: Clear to auscultation. Respiratory effort normal. Cardiovascular system: S1 & S2 heard, RRR. No JVD, murmurs, rubs, gallops or  clicks. No pedal edema. Gastrointestinal system: Abdomen is nondistended, soft and nontender. No organomegaly or masses felt. Normal bowel sounds heard. Central nervous system: Alert and oriented. No focal neurological deficits. Extremities: Symmetric 5 x 5 power. Skin: No rashes, lesions or ulcers Psychiatry: Judgement and insight appear normal. Mood & affect appropriate.   Data  Reviewed:  Lab results reviewed.  Family Communication: significant other updated at bedside.   Disposition: Status is: Inpatient Remains inpatient appropriate because: Severity of disease, IV treatment.  Planned Discharge Destination: Home    Time spent: 50 minutes  Author: Marrion Coy, MD 03/05/2022 12:44 PM  For on call review [email or web address].

## 2022-03-05 NOTE — Progress Notes (Addendum)
Lab called for a critical potassium of less than 2.0. NP Foust made aware. Will continue to monitor.  Update 0225: No new order at this time. Will continue to monitor.  Update 0240: MD Zhang placed order for potassium chloride 10 mEq in 100 ml IVPB every 1 hr x 4. Will continue to monitor.

## 2022-03-05 NOTE — Consult Note (Signed)
PHARMACY CONSULT NOTE  Pharmacy Consult for Electrolyte Monitoring and Replacement   Recent Labs: Potassium (mmol/L)  Date Value  03/05/2022 3.3 (L)   Magnesium (mg/dL)  Date Value  90/30/0923 2.1   Calcium (mg/dL)  Date Value  30/10/6224 7.8 (L)   Albumin (g/dL)  Date Value  33/35/4562 3.3 (L)   Phosphorus (mg/dL)  Date Value  56/38/9373 2.7   Sodium (mmol/L)  Date Value  03/05/2022 141     Assessment: 36 yo female presented to the ED with complaint of generalized muscle weakness and cramping.  Patient was found to have an undetectable potassium.  Last 24 hr: Kcl PO 160 mEq + Kcl IV 110 mEq.   Goal of Therapy:  WNL  Plan:  Starting pt on 1/2NS with Kcl 60 mEq @ 125 ml/hr. (Max rate 160 ml/hr). Started spironolactone 50 mg BID. Will give KCL 10 mEq x 4 and will add on Kcl 40 mEq PO x 1.   11/22:  K @ 2028 = 3.3 Will order KCl 10 mEq IV X 4 and recheck K on 11/23 @ 0500.   Scherrie Gerlach ,PharmD Clinical Pharmacist 03/05/2022 9:49 PM

## 2022-03-05 NOTE — Consult Note (Signed)
PHARMACY CONSULT NOTE  Pharmacy Consult for Electrolyte Monitoring and Replacement   Recent Labs: Potassium (mmol/L)  Date Value  03/05/2022 <2.0 (LL)   Magnesium (mg/dL)  Date Value  16/38/4665 2.0   Calcium (mg/dL)  Date Value  99/35/7017 8.4 (L)   Albumin (g/dL)  Date Value  79/39/0300 3.3 (L)   Phosphorus (mg/dL)  Date Value  92/33/0076 2.7   Sodium (mmol/L)  Date Value  03/04/2022 141     Assessment: 36 yo female presented to the ED with complaint of generalized muscle weakness and cramping.  Patient was found to have an undetectable potassium.  Most recent K+ 11/21 @ 1708 continues to be undetectable.  In the last 24 hours patient as received 80 meq Kcl IV and 120 meq Kcl po for a total of 200 meq.  Goal of Therapy:  WNL  Plan:  11/22:  K @ 0124 = < 2.0 Will order KCl 10 mEq IV X 4 and recheck electrolytes on 11/22 @ 0600.   Scherrie Gerlach ,PharmD Clinical Pharmacist 03/05/2022 2:38 AM

## 2022-03-05 NOTE — Progress Notes (Signed)
[facility]  ROUNDING NOTE   Subjective:  Patient still has significant hypokalemia.  Serum potassium less than 2. However patient appears to be in good spirits. Still has rhabdomyolysis with CK of 13,040. Thus far today has been given 40 mEq of potassium chloride IV and 40 mEq p.o. Patient also on Aldactone now.   Objective:  Vital signs in last 24 hours:  Temp:  [97.9 F (36.6 C)-98.4 F (36.9 C)] 97.9 F (36.6 C) (11/22 1157) Pulse Rate:  [65-86] 76 (11/22 1157) Resp:  [16-18] 18 (11/22 1157) BP: (113-138)/(60-103) 133/88 (11/22 1157) SpO2:  [98 %-100 %] 100 % (11/22 1157) Weight:  [72.8 kg] 72.8 kg (11/22 0102)  Weight change: 4.761 kg Filed Weights   03/03/22 2134 03/05/22 0102  Weight: 68 kg 72.8 kg    Intake/Output: I/O last 3 completed shifts: In: 1717 [P.O.:417; I.V.:1000; IV Piggyback:300] Out: 500 [Urine:500]   Intake/Output this shift:  Total I/O In: 180 [P.O.:180] Out: -   Physical Exam: General: No acute distress  Head: Normocephalic, atraumatic. Moist oral mucosal membranes  Neck: Supple  Lungs:  Clear to auscultation, normal effort  Heart: S1S2 no rubs  Abdomen:  Soft, nontender, bowel sounds present  Extremities: No peripheral edema.  Neurologic: Awake, alert, following commands  Skin: No acute rash  Access: None    Basic Metabolic Panel: Recent Labs  Lab 03/03/22 2200 03/04/22 0410 03/04/22 0612 03/04/22 1222 03/04/22 1708 03/04/22 1716 03/05/22 0124 03/05/22 0557  NA 139  --  141  --   --   --   --  141  K <2.0*   < > <2.0* <2.0* <2.0*  --  <2.0* <2.0*  CL 104  --  113*  --   --   --   --  115*  CO2 25  --  20*  --   --   --   --  22  GLUCOSE 128*  --  109*  --   --   --   --  98  BUN 9  --  9  --   --   --   --  5*  CREATININE 0.56  --  0.50  --   --   --   --  0.41*  CALCIUM 9.2  --  8.4*  --   --   --   --  7.8*  MG 2.1  --   --   --   --  2.1 2.0 2.1   < > = values in this interval not displayed.    Liver  Function Tests: Recent Labs  Lab 03/03/22 2200 03/04/22 0612  AST 274* 259*  ALT 109* 103*  ALKPHOS 67 60  BILITOT 0.6 0.6  PROT 8.1 7.1  ALBUMIN 3.8 3.3*   No results for input(s): "LIPASE", "AMYLASE" in the last 168 hours. No results for input(s): "AMMONIA" in the last 168 hours.  CBC: Recent Labs  Lab 03/03/22 2200 03/04/22 0612 03/05/22 0557  WBC 10.2 5.8 5.4  HGB 12.1 10.7* 9.6*  HCT 38.5 34.1* 29.9*  MCV 79.7* 80.4 79.7*  PLT 364 312 263    Cardiac Enzymes: Recent Labs  Lab 03/03/22 2200 03/05/22 0557  CKTOTAL 9,338* 13,040*    BNP: Invalid input(s): "POCBNP"  CBG: No results for input(s): "GLUCAP" in the last 168 hours.  Microbiology: Results for orders placed or performed during the hospital encounter of 03/03/22  Resp Panel by RT-PCR (Flu A&B, Covid) Anterior Nasal Swab  Status: None   Collection Time: 03/03/22  9:38 PM   Specimen: Anterior Nasal Swab  Result Value Ref Range Status   SARS Coronavirus 2 by RT PCR NEGATIVE NEGATIVE Final    Comment: (NOTE) SARS-CoV-2 target nucleic acids are NOT DETECTED.  The SARS-CoV-2 RNA is generally detectable in upper respiratory specimens during the acute phase of infection. The lowest concentration of SARS-CoV-2 viral copies this assay can detect is 138 copies/mL. A negative result does not preclude SARS-Cov-2 infection and should not be used as the sole basis for treatment or other patient management decisions. A negative result may occur with  improper specimen collection/handling, submission of specimen other than nasopharyngeal swab, presence of viral mutation(s) within the areas targeted by this assay, and inadequate number of viral copies(<138 copies/mL). A negative result must be combined with clinical observations, patient history, and epidemiological information. The expected result is Negative.  Fact Sheet for Patients:  EntrepreneurPulse.com.au  Fact Sheet for Healthcare  Providers:  IncredibleEmployment.be  This test is no t yet approved or cleared by the [place] FDA and  has been authorized for detection and/or diagnosis of SARS-CoV-2 by FDA under an Emergency Use Authorization (EUA). This EUA will remain  in effect (meaning this test can be used) for the duration of the COVID-19 declaration under Section 564(b)(1) of the Act, 21 U.S.C.section 360bbb-3(b)(1), unless the authorization is terminated  or revoked sooner.       Influenza A by PCR NEGATIVE NEGATIVE Final   Influenza B by PCR NEGATIVE NEGATIVE Final    Comment: (NOTE) The Xpert Xpress SARS-CoV-2/FLU/RSV plus assay is intended as an aid in the diagnosis of influenza from Nasopharyngeal swab specimens and should not be used as a sole basis for treatment. Nasal washings and aspirates are unacceptable for Xpert Xpress SARS-CoV-2/FLU/RSV testing.  Fact Sheet for Patients: EntrepreneurPulse.com.au  Fact Sheet for Healthcare Providers: IncredibleEmployment.be  This test is not yet approved or cleared by the [place] FDA and has been authorized for detection and/or diagnosis of SARS-CoV-2 by FDA under an Emergency Use Authorization (EUA). This EUA will remain in effect (meaning this test can be used) for the duration of the COVID-19 declaration under Section 564(b)(1) of the Act, 21 U.S.C. section 360bbb-3(b)(1), unless the authorization is terminated or revoked.  Performed at [facility], [address] [place], [place] [address]     Coagulation Studies: No results for input(s): "LABPROT", "INR" in the last 72 hours.  Urinalysis: Recent Labs    03/04/22 0409  COLORURINE YELLOW*  LABSPEC 1.006  PHURINE 8.0  GLUCOSEU NEGATIVE  HGBUR MODERATE*  BILIRUBINUR NEGATIVE  KETONESUR NEGATIVE  PROTEINUR NEGATIVE  NITRITE NEGATIVE  LEUKOCYTESUR SMALL*      Imaging: No results found.   Medications:     sodium chloride 0.45 % 1,000 mL with potassium chloride 60 mEq infusion 125 mL/hr at 03/05/22 0841    busPIRone  5 mg Oral BID   enoxaparin (LOVENOX) injection  40 mg Subcutaneous Q24H   spironolactone  50 mg Oral BID   acetaminophen **OR** acetaminophen, LORazepam, ondansetron **OR** ondansetron (ZOFRAN) IV  Assessment/ Plan:  36 y.o. female with past medical history significant for anxiety, alcohol abuse who was admitted on 03/03/2022 for severe hypokalemia.  1.  Hypokalemia likely secondary to malnutrition in the setting of extensive alcohol abuse.  Potassium has remained less than 2 since admission.  However her weakness has improved.  Spot urine potassium suggest that there may be some element of renal potassium  wasting.  Continue aggressive potassium repletion as being given.  Agree with adding spironolactone 50 mg twice daily.  2.  Rhabdomyolysis.  May be secondary to severe hypokalemia.  Continue IV fluid hydration along with potassium repletion.   LOS: 1 Pernella Ackerley 11/22/20231:57 PM

## 2022-03-05 NOTE — Plan of Care (Signed)
  Problem: Education: Goal: Knowledge of General Education information will improve Description: Including pain rating scale, medication(s)/side effects and non-pharmacologic comfort measures Outcome: Progressing   Problem: Clinical Measurements: Goal: Respiratory complications will improve Outcome: Progressing   Problem: Nutrition: Goal: Adequate nutrition will be maintained Outcome: Progressing   Problem: Pain Managment: Goal: General experience of comfort will improve Outcome: Progressing   Problem: Safety: Goal: Ability to remain free from injury will improve Outcome: Progressing   

## 2022-03-05 NOTE — Consult Note (Signed)
PHARMACY CONSULT NOTE  Pharmacy Consult for Electrolyte Monitoring and Replacement   Recent Labs: Potassium (mmol/L)  Date Value  03/05/2022 2.1 (LL)   Magnesium (mg/dL)  Date Value  33/82/5053 2.1   Calcium (mg/dL)  Date Value  97/67/3419 7.8 (L)   Albumin (g/dL)  Date Value  37/90/2409 3.3 (L)   Phosphorus (mg/dL)  Date Value  73/53/2992 2.7   Sodium (mmol/L)  Date Value  03/05/2022 141     Assessment: 36 yo female presented to the ED with complaint of generalized muscle weakness and cramping.  Patient was found to have an undetectable potassium.  Last 24 hr: Kcl PO 160 mEq + Kcl IV 110 mEq.   Goal of Therapy:  WNL  Plan:  Starting pt on 1/2NS with Kcl 60 mEq @ 125 ml/hr. (Max rate 160 ml/hr). Started spironolactone 50 mg BID. Will give KCL 10 mEq x 4 and will add on Kcl 40 mEq PO x 1.   Ronnald Ramp ,PharmD Clinical Pharmacist 03/05/2022 3:17 PM

## 2022-03-06 DIAGNOSIS — E876 Hypokalemia: Secondary | ICD-10-CM | POA: Diagnosis not present

## 2022-03-06 LAB — BASIC METABOLIC PANEL
Anion gap: 3 — ABNORMAL LOW (ref 5–15)
BUN: <5 mg/dL — ABNORMAL LOW (ref 6–20)
CO2: 20 mmol/L — ABNORMAL LOW (ref 22–32)
Calcium: 7.7 mg/dL — ABNORMAL LOW (ref 8.9–10.3)
Chloride: 116 mmol/L — ABNORMAL HIGH (ref 98–111)
Creatinine, Ser: <0.3 mg/dL — ABNORMAL LOW (ref 0.44–1.00)
Glucose, Bld: 85 mg/dL (ref 70–99)
Potassium: 3.5 mmol/L (ref 3.5–5.1)
Sodium: 139 mmol/L (ref 135–145)

## 2022-03-06 LAB — CBC
HCT: 28.9 % — ABNORMAL LOW (ref 36.0–46.0)
Hemoglobin: 9.1 g/dL — ABNORMAL LOW (ref 12.0–15.0)
MCH: 25.3 pg — ABNORMAL LOW (ref 26.0–34.0)
MCHC: 31.5 g/dL (ref 30.0–36.0)
MCV: 80.5 fL (ref 80.0–100.0)
Platelets: 250 10*3/uL (ref 150–400)
RBC: 3.59 MIL/uL — ABNORMAL LOW (ref 3.87–5.11)
RDW: 18.1 % — ABNORMAL HIGH (ref 11.5–15.5)
WBC: 4.7 10*3/uL (ref 4.0–10.5)
nRBC: 0 % (ref 0.0–0.2)

## 2022-03-06 LAB — MAGNESIUM: Magnesium: 2.2 mg/dL (ref 1.7–2.4)

## 2022-03-06 LAB — CK: Total CK: 19044 U/L — ABNORMAL HIGH (ref 38–234)

## 2022-03-06 MED ORDER — SODIUM CHLORIDE 0.9 % IV SOLN
510.0000 mg | Freq: Once | INTRAVENOUS | Status: AC
  Administered 2022-03-06: 510 mg via INTRAVENOUS
  Filled 2022-03-06: qty 17

## 2022-03-06 MED ORDER — CYANOCOBALAMIN 1000 MCG/ML IJ SOLN
1000.0000 ug | Freq: Once | INTRAMUSCULAR | Status: AC
  Administered 2022-03-06: 1000 ug via INTRAMUSCULAR
  Filled 2022-03-06: qty 1

## 2022-03-06 NOTE — Consult Note (Signed)
PHARMACY CONSULT NOTE  Pharmacy Consult for Electrolyte Monitoring and Replacement   Recent Labs: Potassium (mmol/L)  Date Value  03/06/2022 3.5   Magnesium (mg/dL)  Date Value  40/81/4481 2.2   Calcium (mg/dL)  Date Value  85/63/1497 7.7 (L)   Albumin (g/dL)  Date Value  02/63/7858 3.3 (L)   Phosphorus (mg/dL)  Date Value  85/05/7739 2.7   Sodium (mmol/L)  Date Value  03/06/2022 139     Assessment: 36 yo female presented to the ED with complaint of generalized muscle weakness and cramping.  Patient was found to have an undetectable potassium.  MIVF: 1/2NS w/ 60 mEq/L KCl at 125 mL/hr --noted spironolactone 50 mg BID   Goal of Therapy:  Electrolytes WNL  Plan:  --no further electrolyte replacement warranted for today --recheck electrolytes in am   Lowella Bandy ,PharmD Clinical Pharmacist 03/06/2022 9:50 AM

## 2022-03-06 NOTE — Progress Notes (Signed)
PROGRESS NOTE    Yvonne Grant  VQQ:[id number] DOB: 01/02/1986 DOA: 03/03/2022 PCP: Patient, No Pcp Per   Brief Narrative:  This 36 years old female with PMH significant of anxiety, EtOH use presents to the hospital with c/o: generalized muscle weakness and cramping.  Lab work showed potassium less than 2.0. Patient was given IV potassium replacements .  Patient had a similar episode in August 2023.  Patient has been receiving massive amount of IV potassium, potassium level still less than 2.  However, leg weakness much improved.  Patient was able to walk better. Random urine potassium level is not high, patient does not seem to have any renal wasting.  Nephrology is consulted. Serum potassium is improving.  Assessment & Plan:   Principal Problem:   Hypokalemia Active Problems:   Anxiety   Rhabdomyolysis   Liver function abnormality  Recurrent severe hypokalemia: Patient denies any recent diarrhea or nausea vomiting.  Not taking any diuretics.   Urine drug screen only positive for marijuana.  She drinks alcohol 1-2 beers a day, probably not the source. This is the second time patient has severe hypokalemia in this year.  She has been receiving massive amount of IV as well as oral potassium, potassium level still low.   Aldactone started at 50 mg twice a day.  Aldosterone/renin ratio is also obtained.  Unlikely to be positive as patient random urine potassium level is not elevated. Patient potassium level still less than 2 after 2 days of IV potassium infusion.  However, patient seemed to be better physically.  At the time of admission, patient could barely stand up, requiring 2 assistance.  Currently, she is able to walk by herself with minimal assistance.  Patient seems to have severe potassium deficit developed over a long period of time.  May take several days to normalize potassium level. For now, continue to monitor patient closely in telemetry unit.  Pharmacy will help with  monitoring and dosing of potassium. Potassium has started showing improvement 2.1> 3.3> 3.5   Nontraumatic rhabdomyolysis: Reviewed patient's medication list patient is not taking any medication which causes rhabdomyolysis. She does not have any neurological complaints.  There is reports of hypokalemia induced rhabdomyolysis.  Probably this is the source. Continue fluids with potassium, monitor CK level.   Abnormal liver functions: Patient has elevated AST/ALT, bilirubin not elevated.   This also could be secondary to severe hypokalemia.   Acute hepatitis panel negative.   Anxiety disorder: Continue buspirone.  Continue as needed Ativan.   DVT prophylaxis: Lovenox Code Status: Full code Family Communication: No family at bed side. Disposition Plan:   Status is: Inpatient Remains inpatient appropriate because: Admitted for severe hypokalemia and nontraumatic rhabdomyolysis requiring IV aggressive fluid resuscitation IV potassium replacement.   Consultants:  Nephrology  Procedures: None Antimicrobials: None  Subjective: Patient was seen and examined at bedside.  Overnight events noted.  Patient reports doing much better.  She reports weakness has significantly improved .  Potassium still remains low and it continued to show some improvement.  Objective: Vitals:   03/06/22 0035 03/06/22 0513 03/06/22 0738 03/06/22 1203  BP: 105/68 (!) 109/59 (!) 132/54 131/88  Pulse: (!) 58 81 84 76  Resp: 17 18 18 18   Temp: 98.1 F (36.7 C) 97.9 F (36.6 C) 97.8 F (36.6 C) 97.7 F (36.5 C)  TempSrc: Axillary     SpO2: 100% 100% 100% 98%  Weight:      Height:  Intake/Output Summary (Last 24 hours) at 03/06/2022 1415 Last data filed at 03/06/2022 0756 Gross per 24 hour  Intake 4016.08 ml  Output 1300 ml  Net 2716.08 ml   Filed Weights   03/03/22 2134 03/05/22 0102  Weight: 68 kg 72.8 kg    Examination:  General exam: Appears comfortable, not in any acute distress.    Respiratory system: CTA bilaterally, respiratory for normal, RR 15 Cardiovascular system: S1 & S2 heard, regular rate and rhythm, no murmur. Gastrointestinal system: Abdomen is soft, non tender, non distended, BS+ Central nervous system: Alert and oriented x 3. No focal neurological deficits. Extremities: No edema, no cyanosis, no clubbing. Skin: No rashes, lesions or ulcers Psychiatry: Judgement and insight appear normal. Mood & affect appropriate.     Data Reviewed: I have personally reviewed following labs and imaging studies  CBC: Recent Labs  Lab 03/03/22 2200 03/04/22 0612 03/05/22 0557 03/06/22 0707  WBC 10.2 5.8 5.4 4.7  HGB 12.1 10.7* 9.6* 9.1*  HCT 38.5 34.1* 29.9* 28.9*  MCV 79.7* 80.4 79.7* 80.5  PLT 364 312 263 250   Basic Metabolic Panel: Recent Labs  Lab 03/03/22 2200 03/04/22 0410 03/04/22 0612 03/04/22 1222 03/04/22 1716 03/05/22 0124 03/05/22 0557 03/05/22 1341 03/05/22 2028 03/06/22 0707  NA 139  --  141  --   --   --  141  --   --  139  K <2.0*   < > <2.0*   < >  --  <2.0* <2.0* 2.1* 3.3* 3.5  CL 104  --  113*  --   --   --  115*  --   --  116*  CO2 25  --  20*  --   --   --  22  --   --  20*  GLUCOSE 128*  --  109*  --   --   --  98  --   --  85  BUN 9  --  9  --   --   --  5*  --   --  <5*  CREATININE 0.56  --  0.50  --   --   --  0.41*  --   --  <0.30*  CALCIUM 9.2  --  8.4*  --   --   --  7.8*  --   --  7.7*  MG 2.1  --   --   --  2.1 2.0 2.1  --   --  2.2   < > = values in this interval not displayed.   GFR: CrCl cannot be calculated (This lab value cannot be used to calculate CrCl because it is not a number: <0.30). Liver Function Tests: Recent Labs  Lab 03/03/22 2200 03/04/22 0612  AST 274* 259*  ALT 109* 103*  ALKPHOS 67 60  BILITOT 0.6 0.6  PROT 8.1 7.1  ALBUMIN 3.8 3.3*   No results for input(s): "LIPASE", "AMYLASE" in the last 168 hours. No results for input(s): "AMMONIA" in the last 168 hours. Coagulation Profile: No  results for input(s): "INR", "PROTIME" in the last 168 hours. Cardiac Enzymes: Recent Labs  Lab 03/03/22 2200 03/05/22 0557 03/06/22 0707  CKTOTAL 9,338* 13,040* 19,044*   BNP (last 3 results) No results for input(s): "PROBNP" in the last 8760 hours. HbA1C: No results for input(s): "HGBA1C" in the last 72 hours. CBG: No results for input(s): "GLUCAP" in the last 168 hours. Lipid Profile: No results for input(s): "CHOL", "HDL", "LDLCALC", "TRIG", "CHOLHDL", "LDLDIRECT"  in the last 72 hours. Thyroid Function Tests: No results for input(s): "TSH", "T4TOTAL", "FREET4", "T3FREE", "THYROIDAB" in the last 72 hours. Anemia Panel: Recent Labs    03/05/22 0928 03/05/22 1341  VITAMINB12  --  130*  FERRITIN 9*  --   TIBC 407  --   IRON <5*  --    Sepsis Labs: No results for input(s): "PROCALCITON", "LATICACIDVEN" in the last 168 hours.  Recent Results (from the past 240 hour(s))  Resp Panel by RT-PCR (Flu A&B, Covid) Anterior Nasal Swab     Status: None   Collection Time: 03/03/22  9:38 PM   Specimen: Anterior Nasal Swab  Result Value Ref Range Status   SARS Coronavirus 2 by RT PCR NEGATIVE NEGATIVE Final    Comment: (NOTE) SARS-CoV-2 target nucleic acids are NOT DETECTED.  The SARS-CoV-2 RNA is generally detectable in upper respiratory specimens during the acute phase of infection. The lowest concentration of SARS-CoV-2 viral copies this assay can detect is 138 copies/mL. A negative result does not preclude SARS-Cov-2 infection and should not be used as the sole basis for treatment or other patient management decisions. A negative result may occur with  improper specimen collection/handling, submission of specimen other than nasopharyngeal swab, presence of viral mutation(s) within the areas targeted by this assay, and inadequate number of viral copies(<138 copies/mL). A negative result must be combined with clinical observations, patient history, and  epidemiological information. The expected result is Negative.  Fact Sheet for Patients:  BloggerCourse.com  Fact Sheet for Healthcare Providers:  SeriousBroker.it  This test is no t yet approved or cleared by the [place] FDA and  has been authorized for detection and/or diagnosis of SARS-CoV-2 by FDA under an Emergency Use Authorization (EUA). This EUA will remain  in effect (meaning this test can be used) for the duration of the COVID-19 declaration under Section 564(b)(1) of the Act, 21 U.S.C.section 360bbb-3(b)(1), unless the authorization is terminated  or revoked sooner.       Influenza A by PCR NEGATIVE NEGATIVE Final   Influenza B by PCR NEGATIVE NEGATIVE Final    Comment: (NOTE) The Xpert Xpress SARS-CoV-2/FLU/RSV plus assay is intended as an aid in the diagnosis of influenza from Nasopharyngeal swab specimens and should not be used as a sole basis for treatment. Nasal washings and aspirates are unacceptable for Xpert Xpress SARS-CoV-2/FLU/RSV testing.  Fact Sheet for Patients: BloggerCourse.com  Fact Sheet for Healthcare Providers: SeriousBroker.it  This test is not yet approved or cleared by the [place] FDA and has been authorized for detection and/or diagnosis of SARS-CoV-2 by FDA under an Emergency Use Authorization (EUA). This EUA will remain in effect (meaning this test can be used) for the duration of the COVID-19 declaration under Section 564(b)(1) of the Act, 21 U.S.C. section 360bbb-3(b)(1), unless the authorization is terminated or revoked.  Performed at [facility], [address] [place], [place] [address]     Radiology Studies: No results found. Scheduled Meds:  busPIRone  5 mg Oral BID   enoxaparin (LOVENOX) injection  40 mg Subcutaneous Q24H   spironolactone  50 mg Oral BID   Continuous Infusions:  sodium chloride 10  mL/hr at 03/05/22 2246   sodium chloride 0.45 % 1,000 mL with potassium chloride 60 mEq infusion 125 mL/hr at 03/05/22 2323     LOS: 2 days    Time spent: 50 mins    Petr Bontempo, MD [facility]   If 7PM-7AM, please contact night-coverage

## 2022-03-06 NOTE — TOC Initial Note (Signed)
Transition of Care [facility]) - Initial/Assessment Note    Patient Details  Name: Yvonne Grant MRN: [id number] Date of Birth: 07/13/1985  Transition of Care [facility]) CM/SW Contact:    Allayne Butcher, RN Phone Number: 03/06/2022, 12:38 PM  Clinical Narrative:                   Transition of Care (TOC) Screening Note   Patient Details  Name: Yvonne Grant Date of Birth: 06-13-1985   Transition of [facility]) CM/SW Contact:    Allayne Butcher, RN Phone Number: 03/06/2022, 12:38 PM    Transition of Care Department [facility]) has reviewed patient and no TOC needs have been identified at this time. We will continue to monitor patient advancement through interdisciplinary progression rounds. If new patient transition needs arise, please place a TOC consult.         Patient Goals and CMS Choice        Expected Discharge Plan and Services                                                Prior Living Arrangements/Services                       Activities of Daily Living Home Assistive Devices/Equipment: None ADL Screening (condition at time of admission) Patient's cognitive ability adequate to safely complete daily activities?: Yes Is the patient deaf or have difficulty hearing?: No Does the patient have difficulty seeing, even when wearing glasses/contacts?: No Does the patient have difficulty concentrating, remembering, or making decisions?: No Patient able to express need for assistance with ADLs?: Yes Does the patient have difficulty dressing or bathing?: No Independently performs ADLs?: Yes (appropriate for developmental age) Does the patient have difficulty walking or climbing stairs?: No Weakness of Legs: None Weakness of Arms/Hands: None  Permission Sought/Granted                  Emotional Assessment              Admission diagnosis:  Hypokalemia [E87.6] Patient Active Problem List   Diagnosis Date Noted   Rhabdomyolysis  03/04/2022   Liver function abnormality 03/04/2022   Hypokalemia 11/27/2021   Microcytic anemia 11/27/2021   Rh negative state in antepartum period 05/14/2021   Threatened abortion 05/14/2021   IUFD at less than 20 weeks of gestation 05/14/2021   Cervical lesion 09/26/2019   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

## 2022-03-06 NOTE — Progress Notes (Signed)
[facility]  ROUNDING NOTE   Subjective:    Patient was seen today on second floor Patient main concern in today's visit was that last night she had spasms of her hands which are now better.   Objective:  Vital signs in last 24 hours:  Temp:  [97.8 F (36.6 C)-98.4 F (36.9 C)] 97.8 F (36.6 C) (11/23 0738) Pulse Rate:  [58-84] 84 (11/23 0738) Resp:  [17-20] 18 (11/23 0738) BP: (105-142)/(54-91) 132/54 (11/23 0738) SpO2:  [100 %] 100 % (11/23 0738)  Weight change:  Filed Weights   03/03/22 2134 03/05/22 0102  Weight: 68 kg 72.8 kg    Intake/Output: I/O last 3 completed shifts: In: 4068.1 [P.O.:597; I.V.:2502; IV Piggyback:969.2] Out: 1800 [Urine:1800]   Intake/Output this shift:  Total I/O In: 744.9 [P.O.:240; I.V.:504.9] Out: -   Physical Exam: General: No acute distress  Head: Normocephalic, atraumatic. Moist oral mucosal membranes  Neck: Supple  Lungs:  Clear to auscultation, normal effort  Heart: S1S2 no rubs  Abdomen:  Soft, nontender, bowel sounds present  Extremities: No peripheral edema.  Neurologic: Awake, alert, following commands  Skin: No acute rash  Access: None    Basic Metabolic Panel: Recent Labs  Lab 03/03/22 2200 03/04/22 0410 03/04/22 0612 03/04/22 1222 03/04/22 1716 03/05/22 0124 03/05/22 0557 03/05/22 1341 03/05/22 2028 03/06/22 0707  NA 139  --  141  --   --   --  141  --   --  139  K <2.0*   < > <2.0*   < >  --  <2.0* <2.0* 2.1* 3.3* 3.5  CL 104  --  113*  --   --   --  115*  --   --  116*  CO2 25  --  20*  --   --   --  22  --   --  20*  GLUCOSE 128*  --  109*  --   --   --  98  --   --  85  BUN 9  --  9  --   --   --  5*  --   --  <5*  CREATININE 0.56  --  0.50  --   --   --  0.41*  --   --  <0.30*  CALCIUM 9.2  --  8.4*  --   --   --  7.8*  --   --  7.7*  MG 2.1  --   --   --  2.1 2.0 2.1  --   --  2.2   < > = values in this interval not displayed.    Liver Function Tests: Recent Labs  Lab  03/03/22 2200 03/04/22 0612  AST 274* 259*  ALT 109* 103*  ALKPHOS 67 60  BILITOT 0.6 0.6  PROT 8.1 7.1  ALBUMIN 3.8 3.3*   No results for input(s): "LIPASE", "AMYLASE" in the last 168 hours. No results for input(s): "AMMONIA" in the last 168 hours.  CBC: Recent Labs  Lab 03/03/22 2200 03/04/22 0612 03/05/22 0557 03/06/22 0707  WBC 10.2 5.8 5.4 4.7  HGB 12.1 10.7* 9.6* 9.1*  HCT 38.5 34.1* 29.9* 28.9*  MCV 79.7* 80.4 79.7* 80.5  PLT 364 312 263 250    Cardiac Enzymes: Recent Labs  Lab 03/03/22 2200 03/05/22 0557 03/06/22 0707  CKTOTAL 9,338* 13,040* 19,044*    BNP: Invalid input(s): "POCBNP"  CBG: No results for input(s): "GLUCAP" in the last 168 hours.  Microbiology: Results for  orders placed or performed during the hospital encounter of 03/03/22  Resp Panel by RT-PCR (Flu A&B, Covid) Anterior Nasal Swab     Status: None   Collection Time: 03/03/22  9:38 PM   Specimen: Anterior Nasal Swab  Result Value Ref Range Status   SARS Coronavirus 2 by RT PCR NEGATIVE NEGATIVE Final    Comment: (NOTE) SARS-CoV-2 target nucleic acids are NOT DETECTED.  The SARS-CoV-2 RNA is generally detectable in upper respiratory specimens during the acute phase of infection. The lowest concentration of SARS-CoV-2 viral copies this assay can detect is 138 copies/mL. A negative result does not preclude SARS-Cov-2 infection and should not be used as the sole basis for treatment or other patient management decisions. A negative result may occur with  improper specimen collection/handling, submission of specimen other than nasopharyngeal swab, presence of viral mutation(s) within the areas targeted by this assay, and inadequate number of viral copies(<138 copies/mL). A negative result must be combined with clinical observations, patient history, and epidemiological information. The expected result is Negative.  Fact Sheet for Patients:   BloggerCourse.com  Fact Sheet for Healthcare Providers:  SeriousBroker.it  This test is no t yet approved or cleared by the [place] FDA and  has been authorized for detection and/or diagnosis of SARS-CoV-2 by FDA under an Emergency Use Authorization (EUA). This EUA will remain  in effect (meaning this test can be used) for the duration of the COVID-19 declaration under Section 564(b)(1) of the Act, 21 U.S.C.section 360bbb-3(b)(1), unless the authorization is terminated  or revoked sooner.       Influenza A by PCR NEGATIVE NEGATIVE Final   Influenza B by PCR NEGATIVE NEGATIVE Final    Comment: (NOTE) The Xpert Xpress SARS-CoV-2/FLU/RSV plus assay is intended as an aid in the diagnosis of influenza from Nasopharyngeal swab specimens and should not be used as a sole basis for treatment. Nasal washings and aspirates are unacceptable for Xpert Xpress SARS-CoV-2/FLU/RSV testing.  Fact Sheet for Patients: BloggerCourse.com  Fact Sheet for Healthcare Providers: SeriousBroker.it  This test is not yet approved or cleared by the [place] FDA and has been authorized for detection and/or diagnosis of SARS-CoV-2 by FDA under an Emergency Use Authorization (EUA). This EUA will remain in effect (meaning this test can be used) for the duration of the COVID-19 declaration under Section 564(b)(1) of the Act, 21 U.S.C. section 360bbb-3(b)(1), unless the authorization is terminated or revoked.  Performed at [facility], [address] [place], [place] [address]     Coagulation Studies: No results for input(s): "LABPROT", "INR" in the last 72 hours.  Urinalysis: Recent Labs    03/04/22 0409  COLORURINE YELLOW*  LABSPEC 1.006  PHURINE 8.0  GLUCOSEU NEGATIVE  HGBUR MODERATE*  BILIRUBINUR NEGATIVE  KETONESUR NEGATIVE  PROTEINUR NEGATIVE  NITRITE NEGATIVE   LEUKOCYTESUR SMALL*      Imaging: No results found.   Medications:    sodium chloride 10 mL/hr at 03/05/22 2246   ferumoxytol (FERAHEME) 510 mg in sodium chloride 0.9 % 100 mL IVPB     sodium chloride 0.45 % 1,000 mL with potassium chloride 60 mEq infusion 125 mL/hr at 03/05/22 2323    busPIRone  5 mg Oral BID   cyanocobalamin  1,000 mcg Intramuscular Once   enoxaparin (LOVENOX) injection  40 mg Subcutaneous Q24H   spironolactone  50 mg Oral BID   sodium chloride, acetaminophen **OR** acetaminophen, LORazepam, ondansetron **OR** ondansetron (ZOFRAN) IV  Assessment/ Plan:  36 y.o. female with  past medical history significant for anxiety, alcohol abuse who was admitted on 03/03/2022 for severe hypokalemia.  1.  Hypokalemia likely secondary to malnutrition in the setting of extensive alcohol abuse.  Potassium has remained less than 2 since admission.  However her weakness has improved.  Spot urine potassium suggest that there may be some element of renal potassium wasting.  Continue aggressive potassium repletion as being given. Patient potassium level is improving after adding spironolactone 50 mg twice daily.  2.  Rhabdomyolysis.  Patient CPK is trending up.  Will continue patient on current IV fluids  3.Vitamin B-12 deficiency  Latest Reference Range & Units 03/05/22 13:41  Vitamin B12 180 - 914 pg/mL 130 (L)  (L): Data is abnormally low Will start patient on B12 replacement   4.  Iron deficiency Will start patient on iron replacement     LOS: 2 Lumir Demetriou s Yareth Macdonnell 11/23/202310:17 AM

## 2022-03-07 DIAGNOSIS — E876 Hypokalemia: Secondary | ICD-10-CM | POA: Diagnosis not present

## 2022-03-07 LAB — POTASSIUM: Potassium: 4.8 mmol/L (ref 3.5–5.1)

## 2022-03-07 LAB — BASIC METABOLIC PANEL
Anion gap: 4 — ABNORMAL LOW (ref 5–15)
BUN: 8 mg/dL (ref 6–20)
CO2: 21 mmol/L — ABNORMAL LOW (ref 22–32)
Calcium: 8.6 mg/dL — ABNORMAL LOW (ref 8.9–10.3)
Chloride: 115 mmol/L — ABNORMAL HIGH (ref 98–111)
Creatinine, Ser: 0.36 mg/dL — ABNORMAL LOW (ref 0.44–1.00)
GFR, Estimated: >60 mL/min (ref >60)
Glucose, Bld: 79 mg/dL (ref 70–99)
Potassium: 4.8 mmol/L (ref 3.5–5.1)
Sodium: 140 mmol/L (ref 135–145)

## 2022-03-07 LAB — CBC
HCT: 31.2 % — ABNORMAL LOW (ref 36.0–46.0)
Hemoglobin: 9.5 g/dL — ABNORMAL LOW (ref 12.0–15.0)
MCH: 25.3 pg — ABNORMAL LOW (ref 26.0–34.0)
MCHC: 30.4 g/dL (ref 30.0–36.0)
MCV: 83.2 fL (ref 80.0–100.0)
Platelets: 274 10*3/uL (ref 150–400)
RBC: 3.75 MIL/uL — ABNORMAL LOW (ref 3.87–5.11)
RDW: 18.1 % — ABNORMAL HIGH (ref 11.5–15.5)
WBC: 4.3 10*3/uL (ref 4.0–10.5)
nRBC: 0 % (ref 0.0–0.2)

## 2022-03-07 LAB — MAGNESIUM: Magnesium: 2.3 mg/dL (ref 1.7–2.4)

## 2022-03-07 LAB — CK: Total CK: 14021 U/L — ABNORMAL HIGH (ref 38–234)

## 2022-03-07 LAB — PHOSPHORUS: Phosphorus: 3.1 mg/dL (ref 2.5–4.6)

## 2022-03-07 MED ORDER — SODIUM CHLORIDE 0.45 % IV SOLN: INTRAVENOUS | Status: DC

## 2022-03-07 NOTE — Evaluation (Signed)
Physical Therapy Evaluation Patient Details Name: Yvonne Grant MRN: [id number] DOB: 12/06/1985 Today's Date: 03/07/2022  History of Present Illness  Pt is a 36 y/o F admitted on 03/03/22 after presenting with c/o generalized muscle weakness & cramping. Pt noted to have K+ levels less than 2.0 & pt also had similar episode in August 2023. Pt received IV K+ replacements. PMH: anxiety, EtOH use  Clinical Impression  Pt seen for PT evaluation with pt agreeable to tx. Pt received in bathroom & performs toileting independently. Pt is able to ambulate without AD but requires either a rail or assistance to negotiate stairs. Pt reports she has fallen multiple times going to her bedroom 2/2 stairs being very steep & they don't have rails (but can hold to wall on either side). Will continue to follow pt acutely to address balance & strengthening associated with stair negotiation to reduce fall risk upon return home. Pt also voices frustration with situation & home stressors but notes she's set up with outpatient therapist already. Requested nurse place chaplain consult.     Recommendations for follow up therapy are one component of a multi-disciplinary discharge planning process, led by the attending physician.  Recommendations may be updated based on patient status, additional functional criteria and insurance authorization.  Follow Up Recommendations No PT follow up      Assistance Recommended at Discharge PRN  Patient can return home with the following       Equipment Recommendations    Recommendations for Other Services       Functional Status Assessment Patient has not had a recent decline in their functional status     Precautions / Restrictions Precautions Precautions: None Restrictions Weight Bearing Restrictions: No      Mobility  Bed Mobility               General bed mobility comments: not tested, pt received in bathroom, left standing in room    Transfers Overall  transfer level: Independent                 General transfer comment: pt transfers off toilet without assistance    Ambulation/Gait Ambulation/Gait assistance: Independent Gait Distance (Feet): 250 Feet Assistive device: None Gait Pattern/deviations: WFL(Within Functional Limits) Gait velocity: WNL        Stairs Stairs: Yes Stairs assistance: Min guard, Supervision Stair Management: No rails, One rail Right Number of Stairs: 4    Wheelchair Mobility    Modified Rankin (Stroke Patients Only)       Balance Overall balance assessment: Independent   Sitting balance-Leahy Scale: Normal       Standing balance-Leahy Scale: Normal                               Pertinent Vitals/Pain Pain Assessment Pain Assessment: No/denies pain    Home Living Family/patient expects to be discharged to:: Private residence Living Arrangements: Spouse/significant other Available Help at Discharge: Family Type of Home: House       Alternate Level Stairs-Number of Steps: flight of very steep stairs to access bedroom on 2nd level Home Layout: Two level        Prior Function Prior Level of Function : Independent/Modified Independent             Mobility Comments: Pt was independent prior to admission, caring for her 2.5 y/o daughter. Pt does endorse multiple falls on the stairs at home 2/2 steep incline of  stairs without rails.       Hand Dominance        Extremity/Trunk Assessment   Upper Extremity Assessment Upper Extremity Assessment: Overall WFL for tasks assessed    Lower Extremity Assessment Lower Extremity Assessment: Overall WFL for tasks assessed       Communication   Communication: No difficulties  Cognition Arousal/Alertness: Awake/alert Behavior During Therapy: WFL for tasks assessed/performed, Anxious Overall Cognitive Status: Within Functional Limits for tasks assessed                                           General Comments      Exercises     Assessment/Plan    PT Assessment Patient needs continued PT services  PT Problem List Decreased balance;Decreased mobility       PT Treatment Interventions Stair training;Functional mobility training;Neuromuscular re-education;Balance training;Therapeutic exercise    PT Goals (Current goals can be found in the Care Plan section)  Acute Rehab PT Goals Patient Stated Goal: get better, go home to see daughter, reduce stressors in home PT Goal Formulation: With patient Time For Goal Achievement: 03/21/22 Potential to Achieve Goals: Good    Frequency Min 2X/week     Co-evaluation               AM-PAC PT "6 Clicks" Mobility  Outcome Measure Help needed turning from your back to your side while in a flat bed without using bedrails?: None Help needed moving from lying on your back to sitting on the side of a flat bed without using bedrails?: None Help needed moving to and from a bed to a chair (including a wheelchair)?: None Help needed standing up from a chair using your arms (e.g., wheelchair or bedside chair)?: None Help needed to walk in hospital room?: None Help needed climbing 3-5 steps with a railing? : None 6 Click Score: 24    End of Session   Activity Tolerance: Patient tolerated treatment well Patient left:  (standing in room with all needs in reach)   PT Visit Diagnosis: Unsteadiness on feet (R26.81);Other abnormalities of gait and mobility (R26.89)    Time: 8413-2440 PT Time Calculation (min) (ACUTE ONLY): 19 min   Charges:   PT Evaluation $PT Eval Low Complexity: 1 Low          Aleda Grana, PT, DPT 03/07/22, 12:41 PM   Sandi Mariscal 03/07/2022, 12:39 PM

## 2022-03-07 NOTE — Progress Notes (Signed)
[facility]  ROUNDING NOTE   Subjective:    Patient was seen today on second floor Patient main complaint in today visit was that she was able to walk and was feeling better than before   Objective:  Vital signs in last 24 hours:  Temp:  [97.7 F (36.5 C)-98.5 F (36.9 C)] 98.2 F (36.8 C) (11/24 0512) Pulse Rate:  [66-77] 66 (11/24 0512) Resp:  [16-18] 17 (11/24 0512) BP: (110-149)/(57-92) 110/57 (11/24 0512) SpO2:  [98 %-100 %] 99 % (11/24 0512) Weight:  [78.5 kg] 78.5 kg (11/24 0515)  Weight change:  Filed Weights   03/03/22 2134 03/05/22 0102 03/07/22 0515  Weight: 68 kg 72.8 kg 78.5 kg    Intake/Output: I/O last 3 completed shifts: In: 6282.4 [P.O.:240; I.V.:5156.3; IV Piggyback:886.2] Out: 600 [Urine:600]   Intake/Output this shift:  No intake/output data recorded.  Physical Exam: General: No acute distress  Head: Normocephalic, atraumatic. Moist oral mucosal membranes  Neck: Supple  Lungs:  Clear to auscultation, normal effort  Heart: S1S2 no rubs  Abdomen:  Soft, nontender, bowel sounds present  Extremities: No peripheral edema.  Neurologic: Awake, alert, following commands  Skin: No acute rash  Access: None    Basic Metabolic Panel: Recent Labs  Lab 03/03/22 2200 03/04/22 0410 03/04/22 0612 03/04/22 1222 03/04/22 1716 03/05/22 0124 03/05/22 0557 03/05/22 1341 03/05/22 2028 03/06/22 0707 03/07/22 0507  NA 139  --  141  --   --   --  141  --   --  139 140  K <2.0*   < > <2.0*   < >  --  <2.0* <2.0* 2.1* 3.3* 3.5 4.8  CL 104  --  113*  --   --   --  115*  --   --  116* 115*  CO2 25  --  20*  --   --   --  22  --   --  20* 21*  GLUCOSE 128*  --  109*  --   --   --  98  --   --  85 79  BUN 9  --  9  --   --   --  5*  --   --  <5* 8  CREATININE 0.56  --  0.50  --   --   --  0.41*  --   --  <0.30* 0.36*  CALCIUM 9.2  --  8.4*  --   --   --  7.8*  --   --  7.7* 8.6*  MG 2.1  --   --   --  2.1 2.0 2.1  --   --  2.2 2.3  PHOS  --    --   --   --   --   --   --   --   --   --  3.1   < > = values in this interval not displayed.    Liver Function Tests: Recent Labs  Lab 03/03/22 2200 03/04/22 0612  AST 274* 259*  ALT 109* 103*  ALKPHOS 67 60  BILITOT 0.6 0.6  PROT 8.1 7.1  ALBUMIN 3.8 3.3*   No results for input(s): "LIPASE", "AMYLASE" in the last 168 hours. No results for input(s): "AMMONIA" in the last 168 hours.  CBC: Recent Labs  Lab 03/03/22 2200 03/04/22 0612 03/05/22 0557 03/06/22 0707 03/07/22 0507  WBC 10.2 5.8 5.4 4.7 4.3  HGB 12.1 10.7* 9.6* 9.1* 9.5*  HCT 38.5 34.1* 29.9* 28.9*  31.2*  MCV 79.7* 80.4 79.7* 80.5 83.2  PLT 364 312 263 250 274    Cardiac Enzymes: Recent Labs  Lab 03/03/22 2200 03/05/22 0557 03/06/22 0707  CKTOTAL 9,338* 13,040* 19,044*    BNP: Invalid input(s): "POCBNP"  CBG: No results for input(s): "GLUCAP" in the last 168 hours.  Microbiology: Results for orders placed or performed during the hospital encounter of 03/03/22  Resp Panel by RT-PCR (Flu A&B, Covid) Anterior Nasal Swab     Status: None   Collection Time: 03/03/22  9:38 PM   Specimen: Anterior Nasal Swab  Result Value Ref Range Status   SARS Coronavirus 2 by RT PCR NEGATIVE NEGATIVE Final    Comment: (NOTE) SARS-CoV-2 target nucleic acids are NOT DETECTED.  The SARS-CoV-2 RNA is generally detectable in upper respiratory specimens during the acute phase of infection. The lowest concentration of SARS-CoV-2 viral copies this assay can detect is 138 copies/mL. A negative result does not preclude SARS-Cov-2 infection and should not be used as the sole basis for treatment or other patient management decisions. A negative result may occur with  improper specimen collection/handling, submission of specimen other than nasopharyngeal swab, presence of viral mutation(s) within the areas targeted by this assay, and inadequate number of viral copies(<138 copies/mL). A negative result must be combined  with clinical observations, patient history, and epidemiological information. The expected result is Negative.  Fact Sheet for Patients:  EntrepreneurPulse.com.au  Fact Sheet for Healthcare Providers:  IncredibleEmployment.be  This test is no t yet approved or cleared by the [place] FDA and  has been authorized for detection and/or diagnosis of SARS-CoV-2 by FDA under an Emergency Use Authorization (EUA). This EUA will remain  in effect (meaning this test can be used) for the duration of the COVID-19 declaration under Section 564(b)(1) of the Act, 21 U.S.C.section 360bbb-3(b)(1), unless the authorization is terminated  or revoked sooner.       Influenza A by PCR NEGATIVE NEGATIVE Final   Influenza B by PCR NEGATIVE NEGATIVE Final    Comment: (NOTE) The Xpert Xpress SARS-CoV-2/FLU/RSV plus assay is intended as an aid in the diagnosis of influenza from Nasopharyngeal swab specimens and should not be used as a sole basis for treatment. Nasal washings and aspirates are unacceptable for Xpert Xpress SARS-CoV-2/FLU/RSV testing.  Fact Sheet for Patients: EntrepreneurPulse.com.au  Fact Sheet for Healthcare Providers: IncredibleEmployment.be  This test is not yet approved or cleared by the [place] FDA and has been authorized for detection and/or diagnosis of SARS-CoV-2 by FDA under an Emergency Use Authorization (EUA). This EUA will remain in effect (meaning this test can be used) for the duration of the COVID-19 declaration under Section 564(b)(1) of the Act, 21 U.S.C. section 360bbb-3(b)(1), unless the authorization is terminated or revoked.  Performed at [facility], [address] [place], [place] [id number]     Coagulation Studies: No results for input(s): "LABPROT", "INR" in the last 72 hours.  Urinalysis: No results for input(s): "COLORURINE", "LABSPEC", "PHURINE",  "GLUCOSEU", "HGBUR", "BILIRUBINUR", "KETONESUR", "PROTEINUR", "UROBILINOGEN", "NITRITE", "LEUKOCYTESUR" in the last 72 hours.  Invalid input(s): "APPERANCEUR"     Imaging: No results found.   Medications:    sodium chloride 10 mL/hr at 03/05/22 2246   sodium chloride 0.45 % 1,000 mL with potassium chloride 60 mEq infusion 125 mL/hr at 03/07/22 0204    busPIRone  5 mg Oral BID   enoxaparin (LOVENOX) injection  40 mg Subcutaneous Q24H   spironolactone  50 mg Oral BID  sodium chloride, acetaminophen **OR** acetaminophen, LORazepam, ondansetron **OR** ondansetron (ZOFRAN) IV  Assessment/ Plan:  36 y.o. female with past medical history significant for anxiety, alcohol abuse who was admitted on 03/03/2022 for severe hypokalemia.  1.  Hypokalemia likely secondary to malnutrition in the setting of extensive alcohol abuse.  Potassium was less than 2 at admission.   Spot urine potassium suggest that there may be some element of renal potassium wasting.   Continue aggressive potassium repletion as being given.  Patient potassium level is improving after adding spironolactone 50 mg twice daily.  2.  Rhabdomyolysis.  Patient is clinically better   Will continue patient on current IV fluids  3.Vitamin B-12 deficiency  Latest Reference Range & Units 03/05/22 13:41  Vitamin B12 180 - 914 pg/mL 130 (L)  (L): Data is abnormally low patient on B12 replacement   4.  Iron deficiency  patient on iron replacement     LOS: 3 Raydon Chappuis s Tamecca Artiga 11/24/20237:41 AM

## 2022-03-07 NOTE — Progress Notes (Signed)
PROGRESS NOTE    Yvonne Grant  ZOX:[id number] DOB: 04/18/1985 DOA: 03/03/2022 PCP: Patient, No Pcp Per   Brief Narrative:  This 36 years old female with PMH significant of anxiety, EtOH use presents to the hospital with c/o: generalized muscle weakness and cramping.  Lab work showed potassium less than 2.0. Patient was given IV potassium replacements .  Patient had a similar episode in August 2023.  Patient has been receiving massive amount of IV potassium, potassium level still less than 2.  However, leg weakness much improved.  Patient was able to walk better. Random urine potassium level is not high, patient does not seem to have any renal wasting.  Nephrology is consulted. Serum potassium is improving.  Assessment & Plan:   Principal Problem:   Hypokalemia Active Problems:   Anxiety   Rhabdomyolysis   Liver function abnormality  Recurrent severe hypokalemia: Patient denies any recent diarrhea or nausea , vomiting.  Not taking any diuretics.   Urine drug screen only positive for marijuana.She drinks alcohol 1-2 beers a day, probably not the source. This is the second time patient has severe hypokalemia in this year.  She has been receiving massive amount of IV as well as oral potassium, potassium level still low.   Aldactone started at 50 mg twice a day.  Aldosterone/renin ratio is also obtained.  Unlikely to be positive as patient random urine potassium level is not elevated. Patient potassium level remained less than 2 after 2 days of IV potassium infusion.  However, patient seemed to be better physically.  At the time of admission, patient could barely stand up, requiring 2 assistance.  Currently, she is able to walk by herself with minimal assistance.  Patient seems to have severe potassium deficit developed over a long period of time.  May take several days to normalize potassium level. For now, continue to monitor patient closely in telemetry unit.  Pharmacy will help with  monitoring and dosing of potassium. Potassium has started showing improvement 2.1> 3.3> 3.5>4.8 Serum potassium has improved after addition of spironolactone.   Nontraumatic rhabdomyolysis: Reviewed patient's medication list patient is not taking any medication which causes rhabdomyolysis. She does not have any neurological complaints.  There is reports of hypokalemia induced rhabdomyolysis.  Probably this is the source. Continue fluids with potassium, monitor CK level.   Abnormal liver functions: Patient has elevated AST/ALT, bilirubin not elevated.   This also could be secondary to severe hypokalemia.   Acute hepatitis panel negative.   Anxiety disorder: Continue buspirone.  Continue as needed Ativan.   DVT prophylaxis: Lovenox Code Status: Full code Family Communication: No family at bed side. Disposition Plan:   Status is: Inpatient Remains inpatient appropriate because: Admitted for severe hypokalemia and nontraumatic rhabdomyolysis requiring IV aggressive fluid resuscitation,  IV potassium replacement.  Serum potassium has improved.  CK level is trending down.  Anticipated discharge home 03/08/2022.   Consultants:  Nephrology  Procedures: None Antimicrobials: None  Subjective: Patient was seen and examined at bedside.  Overnight events noted.   Patient reports doing much better. She reports weakness has significantly improved. Serum potassium has improved.  Patient also reports significant improvement ambulating.  Objective: Vitals:   03/07/22 0512 03/07/22 0515 03/07/22 0857 03/07/22 1208  BP: (!) 110/57  126/78 119/73  Pulse: 66  77 71  Resp: 17  (!) 22 18  Temp: 98.2 F (36.8 C)  (!) 97.5 F (36.4 C) 97.9 F (36.6 C)  TempSrc: Oral  SpO2: 99%  100% 100%  Weight:  78.5 kg    Height:        Intake/Output Summary (Last 24 hours) at 03/07/2022 1401 Last data filed at 03/07/2022 0010 Gross per 24 hour  Intake 2266.33 ml  Output --  Net 2266.33 ml    Filed Weights   03/03/22 2134 03/05/22 0102 03/07/22 0515  Weight: 68 kg 72.8 kg 78.5 kg    Examination:  General exam: Appears comfortable, not in any acute distress.  Respiratory system: CTA bilaterally, respiratory effort normal, RR 13. Cardiovascular system: S1 & S2 heard, regular rate and rhythm, no murmur. Gastrointestinal system: Abdomen is soft, non tender, non distended, BS+ Central nervous system: Alert and oriented x 3. No focal neurological deficits. Extremities: No edema, no cyanosis, no clubbing. Skin: No rashes, lesions or ulcers Psychiatry: Judgement and insight appear normal. Mood & affect appropriate.     Data Reviewed: I have personally reviewed following labs and imaging studies  CBC: Recent Labs  Lab 03/03/22 2200 03/04/22 0612 03/05/22 0557 03/06/22 0707 03/07/22 0507  WBC 10.2 5.8 5.4 4.7 4.3  HGB 12.1 10.7* 9.6* 9.1* 9.5*  HCT 38.5 34.1* 29.9* 28.9* 31.2*  MCV 79.7* 80.4 79.7* 80.5 83.2  PLT 364 312 263 250 274   Basic Metabolic Panel: Recent Labs  Lab 03/03/22 2200 03/04/22 0410 03/04/22 0612 03/04/22 1222 03/04/22 1716 03/05/22 0124 03/05/22 0557 03/05/22 1341 03/05/22 2028 03/06/22 0707 03/07/22 0507  NA 139  --  141  --   --   --  141  --   --  139 140  K <2.0*   < > <2.0*   < >  --  <2.0* <2.0* 2.1* 3.3* 3.5 4.8  CL 104  --  113*  --   --   --  115*  --   --  116* 115*  CO2 25  --  20*  --   --   --  22  --   --  20* 21*  GLUCOSE 128*  --  109*  --   --   --  98  --   --  85 79  BUN 9  --  9  --   --   --  5*  --   --  <5* 8  CREATININE 0.56  --  0.50  --   --   --  0.41*  --   --  <0.30* 0.36*  CALCIUM 9.2  --  8.4*  --   --   --  7.8*  --   --  7.7* 8.6*  MG 2.1  --   --   --  2.1 2.0 2.1  --   --  2.2 2.3  PHOS  --   --   --   --   --   --   --   --   --   --  3.1   < > = values in this interval not displayed.   GFR: Estimated Creatinine Clearance: 103.8 mL/min (A) (by C-G formula based on SCr of 0.36 mg/dL (L)). Liver  Function Tests: Recent Labs  Lab 03/03/22 2200 03/04/22 0612  AST 274* 259*  ALT 109* 103*  ALKPHOS 67 60  BILITOT 0.6 0.6  PROT 8.1 7.1  ALBUMIN 3.8 3.3*   No results for input(s): "LIPASE", "AMYLASE" in the last 168 hours. No results for input(s): "AMMONIA" in the last 168 hours. Coagulation Profile: No results for input(s): "INR", "PROTIME" in the  last 168 hours. Cardiac Enzymes: Recent Labs  Lab 03/03/22 2200 03/05/22 0557 03/06/22 0707 03/07/22 0507  CKTOTAL 9,338* 13,040* 19,044* 14,021*   BNP (last 3 results) No results for input(s): "PROBNP" in the last 8760 hours. HbA1C: No results for input(s): "HGBA1C" in the last 72 hours. CBG: No results for input(s): "GLUCAP" in the last 168 hours. Lipid Profile: No results for input(s): "CHOL", "HDL", "LDLCALC", "TRIG", "CHOLHDL", "LDLDIRECT" in the last 72 hours. Thyroid Function Tests: No results for input(s): "TSH", "T4TOTAL", "FREET4", "T3FREE", "THYROIDAB" in the last 72 hours. Anemia Panel: Recent Labs    03/05/22 0928 03/05/22 1341  VITAMINB12  --  130*  FERRITIN 9*  --   TIBC 407  --   IRON <5*  --    Sepsis Labs: No results for input(s): "PROCALCITON", "LATICACIDVEN" in the last 168 hours.  Recent Results (from the past 240 hour(s))  Resp Panel by RT-PCR (Flu A&B, Covid) Anterior Nasal Swab     Status: None   Collection Time: 03/03/22  9:38 PM   Specimen: Anterior Nasal Swab  Result Value Ref Range Status   SARS Coronavirus 2 by RT PCR NEGATIVE NEGATIVE Final    Comment: (NOTE) SARS-CoV-2 target nucleic acids are NOT DETECTED.  The SARS-CoV-2 RNA is generally detectable in upper respiratory specimens during the acute phase of infection. The lowest concentration of SARS-CoV-2 viral copies this assay can detect is 138 copies/mL. A negative result does not preclude SARS-Cov-2 infection and should not be used as the sole basis for treatment or other patient management decisions. A negative result may  occur with  improper specimen collection/handling, submission of specimen other than nasopharyngeal swab, presence of viral mutation(s) within the areas targeted by this assay, and inadequate number of viral copies(<138 copies/mL). A negative result must be combined with clinical observations, patient history, and epidemiological information. The expected result is Negative.  Fact Sheet for Patients:  BloggerCourse.com  Fact Sheet for Healthcare Providers:  SeriousBroker.it  This test is no t yet approved or cleared by the [place] FDA and  has been authorized for detection and/or diagnosis of SARS-CoV-2 by FDA under an Emergency Use Authorization (EUA). This EUA will remain  in effect (meaning this test can be used) for the duration of the COVID-19 declaration under Section 564(b)(1) of the Act, 21 U.S.C.section 360bbb-3(b)(1), unless the authorization is terminated  or revoked sooner.       Influenza A by PCR NEGATIVE NEGATIVE Final   Influenza B by PCR NEGATIVE NEGATIVE Final    Comment: (NOTE) The Xpert Xpress SARS-CoV-2/FLU/RSV plus assay is intended as an aid in the diagnosis of influenza from Nasopharyngeal swab specimens and should not be used as a sole basis for treatment. Nasal washings and aspirates are unacceptable for Xpert Xpress SARS-CoV-2/FLU/RSV testing.  Fact Sheet for Patients: BloggerCourse.com  Fact Sheet for Healthcare Providers: SeriousBroker.it  This test is not yet approved or cleared by the [place] FDA and has been authorized for detection and/or diagnosis of SARS-CoV-2 by FDA under an Emergency Use Authorization (EUA). This EUA will remain in effect (meaning this test can be used) for the duration of the COVID-19 declaration under Section 564(b)(1) of the Act, 21 U.S.C. section 360bbb-3(b)(1), unless the authorization is terminated  or revoked.  Performed at [facility], [address], [place], [place] [address]     Radiology Studies: No results found. Scheduled Meds:  busPIRone  5 mg Oral BID   enoxaparin (LOVENOX) injection  40 mg  Subcutaneous Q24H   spironolactone  50 mg Oral BID   Continuous Infusions:  sodium chloride 125 mL/hr at 03/07/22 1052   sodium chloride 10 mL/hr at 03/05/22 2246     LOS: 3 days    Time spent: 35 mins    Gwendlyon Zumbro, MD [facility]   If 7PM-7AM, please contact night-coverage

## 2022-03-07 NOTE — Consult Note (Signed)
PHARMACY CONSULT NOTE  Pharmacy Consult for Electrolyte Monitoring and Replacement   Recent Labs: Potassium (mmol/L)  Date Value  03/07/2022 4.8   Magnesium (mg/dL)  Date Value  29/47/6546 2.3   Calcium (mg/dL)  Date Value  50/35/4656 8.6 (L)   Albumin (g/dL)  Date Value  81/27/5170 3.3 (L)   Phosphorus (mg/dL)  Date Value  01/74/9449 3.1   Sodium (mmol/L)  Date Value  03/07/2022 140     Assessment: 36 yo female presented to the ED with complaint of generalized muscle weakness and cramping.  Patient was found to have an undetectable potassium.  MIVF: 1/2NS w/ 60 mEq/L KCl at 125 mL/hr --noted spironolactone 50 mg BID   Goal of Therapy:  Electrolytes WNL  Plan:  --will take potassium out of the fluid bag.  --recheck electrolytes in am   Ronnald Ramp ,PharmD Clinical Pharmacist 03/07/2022 8:27 AM

## 2022-03-08 DIAGNOSIS — E876 Hypokalemia: Secondary | ICD-10-CM | POA: Diagnosis not present

## 2022-03-08 LAB — CBC
HCT: 32.5 % — ABNORMAL LOW (ref 36.0–46.0)
Hemoglobin: 10.1 g/dL — ABNORMAL LOW (ref 12.0–15.0)
MCH: 25.6 pg — ABNORMAL LOW (ref 26.0–34.0)
MCHC: 31.1 g/dL (ref 30.0–36.0)
MCV: 82.3 fL (ref 80.0–100.0)
Platelets: 305 10*3/uL (ref 150–400)
RBC: 3.95 MIL/uL (ref 3.87–5.11)
RDW: 18.1 % — ABNORMAL HIGH (ref 11.5–15.5)
WBC: 4.4 10*3/uL (ref 4.0–10.5)
nRBC: 0 % (ref 0.0–0.2)

## 2022-03-08 LAB — BASIC METABOLIC PANEL
Anion gap: 7 (ref 5–15)
BUN: 7 mg/dL (ref 6–20)
CO2: 19 mmol/L — ABNORMAL LOW (ref 22–32)
Calcium: 9 mg/dL (ref 8.9–10.3)
Chloride: 113 mmol/L — ABNORMAL HIGH (ref 98–111)
Creatinine, Ser: 0.43 mg/dL — ABNORMAL LOW (ref 0.44–1.00)
GFR, Estimated: >60 mL/min (ref >60)
Glucose, Bld: 76 mg/dL (ref 70–99)
Potassium: 4.2 mmol/L (ref 3.5–5.1)
Sodium: 139 mmol/L (ref 135–145)

## 2022-03-08 LAB — PHOSPHORUS: Phosphorus: 4.3 mg/dL (ref 2.5–4.6)

## 2022-03-08 LAB — MAGNESIUM: Magnesium: 2.2 mg/dL (ref 1.7–2.4)

## 2022-03-08 MED ORDER — SPIRONOLACTONE 50 MG PO TABS: 50.0000 mg | ORAL_TABLET | Freq: Two times a day (BID) | ORAL | 1 refills | Status: AC

## 2022-03-08 NOTE — Discharge Instructions (Addendum)
Advised to follow-up with primary care physician in 1 week. Advised to take Aldactone 50 mg 3 times daily for hypokalemia.

## 2022-03-08 NOTE — Consult Note (Signed)
PHARMACY CONSULT NOTE  Pharmacy Consult for Electrolyte Monitoring and Replacement   Recent Labs: Potassium (mmol/L)  Date Value  03/08/2022 4.2   Magnesium (mg/dL)  Date Value  04/16/7251 2.2   Calcium (mg/dL)  Date Value  66/44/0347 9.0   Albumin (g/dL)  Date Value  42/59/5638 3.3 (L)   Phosphorus (mg/dL)  Date Value  75/64/3329 4.3   Sodium (mmol/L)  Date Value  03/08/2022 139     Assessment: 36 yo female presented to the ED with complaint of generalized muscle weakness and cramping.  Patient was found to have an undetectable potassium.  MIVF: 1/2NS at 125 mL/hr --noted spironolactone 50 mg BID   Goal of Therapy:  Electrolytes WNL  Plan:  --no electrolyte replacement warranted for today --recheck electrolytes in am   Lowella Bandy ,PharmD Clinical Pharmacist 03/08/2022 9:35 AM

## 2022-03-08 NOTE — Progress Notes (Signed)
[facility]  ROUNDING NOTE   Subjective:    Patient was seen today on second floor.Patient offers no physical complaint.  Patient states she is feeling better than before    Objective:  Vital signs in last 24 hours:  Temp:  [97.4 F (36.3 C)-98.3 F (36.8 C)] 98.3 F (36.8 C) (11/25 0450) Pulse Rate:  [70-78] 76 (11/25 0450) Resp:  [18-22] 18 (11/25 0450) BP: (109-128)/(68-86) 117/74 (11/25 0450) SpO2:  [99 %-100 %] 100 % (11/25 0450)  Weight change:  Filed Weights   03/03/22 2134 03/05/22 0102 03/07/22 0515  Weight: 68 kg 72.8 kg 78.5 kg    Intake/Output: I/O last 3 completed shifts: In: 3902.9 [P.O.:240; I.V.:3545.9; IV Piggyback:117] Out: -    Intake/Output this shift:  Total I/O In: 480 [P.O.:480] Out: -   Physical Exam: General: No acute distress  Head: Normocephalic, atraumatic. Moist oral mucosal membranes  Neck: Supple  Lungs:  Clear to auscultation, normal effort  Heart: S1S2 no rubs  Abdomen:  Soft, nontender, bowel sounds present  Extremities: No peripheral edema.  Neurologic: Awake, alert, following commands  Skin: No acute rash  Access: None    Basic Metabolic Panel: Recent Labs  Lab 03/03/22 2200 03/04/22 0410 03/04/22 0612 03/04/22 1222 03/04/22 1716 03/05/22 0124 03/05/22 0557 03/05/22 1341 03/05/22 2028 03/06/22 0707 03/07/22 0507 03/07/22 1428  NA 139  --  141  --   --   --  141  --   --  139 140  --   K <2.0*   < > <2.0*   < >  --  <2.0* <2.0* 2.1* 3.3* 3.5 4.8 4.8  CL 104  --  113*  --   --   --  115*  --   --  116* 115*  --   CO2 25  --  20*  --   --   --  22  --   --  20* 21*  --   GLUCOSE 128*  --  109*  --   --   --  98  --   --  85 79  --   BUN 9  --  9  --   --   --  5*  --   --  <5* 8  --   CREATININE 0.56  --  0.50  --   --   --  0.41*  --   --  <0.30* 0.36*  --   CALCIUM 9.2  --  8.4*  --   --   --  7.8*  --   --  7.7* 8.6*  --   MG 2.1  --   --   --  2.1 2.0 2.1  --   --  2.2 2.3  --   PHOS  --   --    --   --   --   --   --   --   --   --  3.1  --    < > = values in this interval not displayed.    Liver Function Tests: Recent Labs  Lab 03/03/22 2200 03/04/22 0612  AST 274* 259*  ALT 109* 103*  ALKPHOS 67 60  BILITOT 0.6 0.6  PROT 8.1 7.1  ALBUMIN 3.8 3.3*   No results for input(s): "LIPASE", "AMYLASE" in the last 168 hours. No results for input(s): "AMMONIA" in the last 168 hours.  CBC: Recent Labs  Lab 03/03/22 2200 03/04/22 0612 03/05/22 0557 03/06/22 0707 03/07/22  0507  WBC 10.2 5.8 5.4 4.7 4.3  HGB 12.1 10.7* 9.6* 9.1* 9.5*  HCT 38.5 34.1* 29.9* 28.9* 31.2*  MCV 79.7* 80.4 79.7* 80.5 83.2  PLT 364 312 263 250 274    Cardiac Enzymes: Recent Labs  Lab 03/03/22 2200 03/05/22 0557 03/06/22 0707 03/07/22 0507  CKTOTAL 9,338* 13,040* 19,044* 14,021*    BNP: Invalid input(s): "POCBNP"  CBG: No results for input(s): "GLUCAP" in the last 168 hours.  Microbiology: Results for orders placed or performed during the hospital encounter of 03/03/22  Resp Panel by RT-PCR (Flu A&B, Covid) Anterior Nasal Swab     Status: None   Collection Time: 03/03/22  9:38 PM   Specimen: Anterior Nasal Swab  Result Value Ref Range Status   SARS Coronavirus 2 by RT PCR NEGATIVE NEGATIVE Final    Comment: (NOTE) SARS-CoV-2 target nucleic acids are NOT DETECTED.  The SARS-CoV-2 RNA is generally detectable in upper respiratory specimens during the acute phase of infection. The lowest concentration of SARS-CoV-2 viral copies this assay can detect is 138 copies/mL. A negative result does not preclude SARS-Cov-2 infection and should not be used as the sole basis for treatment or other patient management decisions. A negative result may occur with  improper specimen collection/handling, submission of specimen other than nasopharyngeal swab, presence of viral mutation(s) within the areas targeted by this assay, and inadequate number of viral copies(<138 copies/mL). A negative  result must be combined with clinical observations, patient history, and epidemiological information. The expected result is Negative.  Fact Sheet for Patients:  BloggerCourse.com  Fact Sheet for Healthcare Providers:  SeriousBroker.it  This test is no t yet approved or cleared by the [place] FDA and  has been authorized for detection and/or diagnosis of SARS-CoV-2 by FDA under an Emergency Use Authorization (EUA). This EUA will remain  in effect (meaning this test can be used) for the duration of the COVID-19 declaration under Section 564(b)(1) of the Act, 21 U.S.C.section 360bbb-3(b)(1), unless the authorization is terminated  or revoked sooner.       Influenza A by PCR NEGATIVE NEGATIVE Final   Influenza B by PCR NEGATIVE NEGATIVE Final    Comment: (NOTE) The Xpert Xpress SARS-CoV-2/FLU/RSV plus assay is intended as an aid in the diagnosis of influenza from Nasopharyngeal swab specimens and should not be used as a sole basis for treatment. Nasal washings and aspirates are unacceptable for Xpert Xpress SARS-CoV-2/FLU/RSV testing.  Fact Sheet for Patients: BloggerCourse.com  Fact Sheet for Healthcare Providers: SeriousBroker.it  This test is not yet approved or cleared by the [place] FDA and has been authorized for detection and/or diagnosis of SARS-CoV-2 by FDA under an Emergency Use Authorization (EUA). This EUA will remain in effect (meaning this test can be used) for the duration of the COVID-19 declaration under Section 564(b)(1) of the Act, 21 U.S.C. section 360bbb-3(b)(1), unless the authorization is terminated or revoked.  Performed at [facility], [address] [place], [place] [address]     Coagulation Studies: No results for input(s): "LABPROT", "INR" in the last 72 hours.  Urinalysis: No results for input(s): "COLORURINE",  "LABSPEC", "PHURINE", "GLUCOSEU", "HGBUR", "BILIRUBINUR", "KETONESUR", "PROTEINUR", "UROBILINOGEN", "NITRITE", "LEUKOCYTESUR" in the last 72 hours.  Invalid input(s): "APPERANCEUR"     Imaging: No results found.   Medications:    sodium chloride 125 mL/hr at 03/07/22 2324   sodium chloride 10 mL/hr at 03/05/22 2246    busPIRone  5 mg Oral BID   enoxaparin (LOVENOX) injection  40 mg Subcutaneous Q24H   spironolactone  50 mg Oral BID   sodium chloride, acetaminophen **OR** acetaminophen, LORazepam, ondansetron **OR** ondansetron (ZOFRAN) IV  Assessment/ Plan:  36 y.o. female with past medical history significant for anxiety, alcohol abuse who was admitted on 03/03/2022 for severe hypokalemia.  1.  Hypokalemia likely secondary to malnutrition in the setting of extensive alcohol abuse.  Potassium was less than 2 at admission.   Spot urine potassium suggest that there may be some element of renal potassium wasting.   Continue aggressive potassium repletion as being given.  Patient potassium is now within normal limits.  Will continue spironolactone 50 mg twice daily.  2.  Rhabdomyolysis. Patient CPK is now trending down Patient is clinically better   3.Vitamin B-12 deficiency  Latest Reference Range & Units 03/05/22 13:41  Vitamin B12 180 - 914 pg/mL 130 (L)  (L): Data is abnormally low patient on B12 replacement   4.  Iron deficiency  patient on iron replacement     LOS: 4 Randee Huston s Venus Gilles 11/25/20235:58 AM

## 2022-03-08 NOTE — Progress Notes (Signed)
Patient  alert and oriented x4. Slept for most of the night. Husband at bedside. Denied pain. Ambulated in hallway around the unit several times.

## 2022-03-08 NOTE — Progress Notes (Signed)
   03/08/22 0900  Clinical Encounter Type  Visited With Patient  Visit Type Initial  Referral From Nurse  Consult/Referral To Chaplain    Chaplain responded to request for support. Patient with care team. Chaplain services are available for follow up as needed.

## 2022-03-08 NOTE — Discharge Summary (Signed)
Physician Discharge Summary  Yvonne Grant KVQ:[id number] DOB: December 19, 1985 DOA: 03/03/2022  PCP: Patient, No Pcp Per  Admit date: 03/03/2022  Discharge date: 03/08/2022  Admitted From: Home.  Disposition:  Home  Recommendations for Outpatient Follow-up:  Follow up with PCP in 1-2 weeks. Please obtain BMP/CBC in one week. Advised to take Aldactone 50 mg 2 times daily for hypokalemia. Advised to refrain from drinking alcohol.  Home Health: None Equipment/Devices:None  Discharge Condition: Stable CODE STATUS: Full code Diet recommendation: Regular diet  Brief Summary / Hospital Course: This 36 years old female with PMH significant of anxiety, EtOH use presents to the hospital with c/o: generalized muscle weakness and cramping.  Lab work showed potassium less than 2.0. Patient was admitted for generalized weakness secondary to hypokalemia.  Patient was given IV potassium replacements .  Patient had a similar episode in August 2023. Patient has been receiving massive amount of IV potassium, potassium level still less than 2.  However, leg weakness much improved.  Nephrology was consulted.  Patient was a started on Aldactone 50 mg every 12 hours.  Patient was able to walk better. Random urine potassium level is not high, patient does not seem to have any renal wasting.  Patient was continued on IV fluid resuscitation with potassium supplements.  Subsequently potassium has improved and remained stable.  Patient was also found to have elevated CK level consistent with nontraumatic rhabdomyolysis which has improved with IV fluid resuscitation.  Patient feels better ambulating well.  Wants to be discharged.  Patient was advised to refrain from drinking alcohol.  Patient being discharged home.  Discharge Diagnoses:  Principal Problem:   Hypokalemia Active Problems:   Anxiety   Rhabdomyolysis   Liver function abnormality  Recurrent severe hypokalemia: Patient denies any recent diarrhea or  nausea , vomiting.  Not taking any diuretics.   Urine drug screen only positive for marijuana.She drinks alcohol 1-2 beers a day, probably not the source. This is the second time patient has severe hypokalemia in this year.  She has been receiving massive amount of IV as well as oral potassium, potassium level still low.   Aldactone started at 50 mg twice a day.  Aldosterone/renin ratio is also obtained. Unlikely to be positive as patient random urine potassium level is not elevated. Patient potassium level remained less than 2 after 2 days of IV potassium infusion. However, patient seemed to be better physically.  At the time of admission, patient could barely stand up, requiring 2 assistance.  Currently, she is able to walk by herself with minimal assistance.  Patient seems to have severe potassium deficit developed over a long period of time.  May take several days to normalize potassium level. Pharmacy helping with monitoring and dosing of potassium. Potassium has started showing improvement 2.1> 3.3> 3.5>4.8 Serum potassium has improved after addition of spironolactone.   Nontraumatic rhabdomyolysis: Reviewed patient's medication list patient is not taking any medication which causes rhabdomyolysis. She does not have any neurological complaints. There is reports of hypokalemia induced rhabdomyolysis.  Probably this is the source. Continue fluids with potassium, monitor CK level. CK level trending down.   Abnormal liver functions: Patient has elevated AST/ALT, bilirubin not elevated.   This also could be secondary to severe hypokalemia.   Acute hepatitis panel negative.   Anxiety disorder: Continue buspirone.  Continue as needed Ativan.  Discharge Instructions  Discharge Instructions     Call MD for:  difficulty breathing, headache or visual disturbances   Complete by:  As directed    Call MD for:  persistant dizziness or light-headedness   Complete by: As directed    Call MD for:   persistant nausea and vomiting   Complete by: As directed    Diet - low sodium heart healthy   Complete by: As directed    Diet Carb Modified   Complete by: As directed    Discharge instructions   Complete by: As directed    Advised to follow-up with primary care physician in 1 week. Advised to take Aldactone 50 mg 3 times daily for hypokalemia. Advised to follow-up with nephrology as scheduled.   Increase activity slowly   Complete by: As directed       Allergies as of 03/08/2022   No Known Allergies      Medication List     STOP taking these medications    multivitamin with minerals Tabs tablet       TAKE these medications    busPIRone 5 MG tablet Commonly known as: BUSPAR Take 5 mg by mouth 2 (two) times daily.   cyanocobalamin 1000 MCG/ML injection Commonly known as: VITAMIN B12 1000 mcg weekly for 4 weeks then 1000 mcg monthly   spironolactone 50 MG tablet Commonly known as: ALDACTONE Take 1 tablet (50 mg total) by mouth 2 (two) times daily.        Follow-up Information     PCP Follow up.                 No Known Allergies  Consultations: Nephrology   Procedures/Studies: No results found.  Subjective: Patient was seen and examined at bedside.  Overnight events noted.   Patient reports doing much better and want to be discharged.  She is ambulating without any weakness.  Potassium Improved.  Discharge Exam: Vitals:   03/08/22 0450 03/08/22 0803  BP: 117/74 126/74  Pulse: 76 75  Resp: 18 17  Temp: 98.3 F (36.8 C) 98 F (36.7 C)  SpO2: 100% 100%   Vitals:   03/07/22 2041 03/07/22 2327 03/08/22 0450 03/08/22 0803  BP: 121/82 109/68 117/74 126/74  Pulse: 78 70 76 75  Resp: 18 18 18 17   Temp: 98.3 F (36.8 C) 98.1 F (36.7 C) 98.3 F (36.8 C) 98 F (36.7 C)  TempSrc: Oral Oral Oral   SpO2: 100% 99% 100% 100%  Weight:      Height:        General: Pt is alert, awake, not in acute distress Cardiovascular: RRR, S1/S2 +,  no rubs, no gallops Respiratory: CTA bilaterally, no wheezing, no rhonchi Abdominal: Soft, NT, ND, bowel sounds + Extremities: no edema, no cyanosis    The results of significant diagnostics from this hospitalization (including imaging, microbiology, ancillary and laboratory) are listed below for reference.     Microbiology: Recent Results (from the past 240 hour(s))  Resp Panel by RT-PCR (Flu A&B, Covid) Anterior Nasal Swab     Status: None   Collection Time: 03/03/22  9:38 PM   Specimen: Anterior Nasal Swab  Result Value Ref Range Status   SARS Coronavirus 2 by RT PCR NEGATIVE NEGATIVE Final    Comment: (NOTE) SARS-CoV-2 target nucleic acids are NOT DETECTED.  The SARS-CoV-2 RNA is generally detectable in upper respiratory specimens during the acute phase of infection. The lowest concentration of SARS-CoV-2 viral copies this assay can detect is 138 copies/mL. A negative result does not preclude SARS-Cov-2 infection and should not be used as the sole basis for treatment or  other patient management decisions. A negative result may occur with  improper specimen collection/handling, submission of specimen other than nasopharyngeal swab, presence of viral mutation(s) within the areas targeted by this assay, and inadequate number of viral copies(<138 copies/mL). A negative result must be combined with clinical observations, patient history, and epidemiological information. The expected result is Negative.  Fact Sheet for Patients:  EntrepreneurPulse.com.au  Fact Sheet for Healthcare Providers:  IncredibleEmployment.be  This test is no t yet approved or cleared by the [place] FDA and  has been authorized for detection and/or diagnosis of SARS-CoV-2 by FDA under an Emergency Use Authorization (EUA). This EUA will remain  in effect (meaning this test can be used) for the duration of the COVID-19 declaration under Section 564(b)(1) of the  Act, 21 U.S.C.section 360bbb-3(b)(1), unless the authorization is terminated  or revoked sooner.       Influenza A by PCR NEGATIVE NEGATIVE Final   Influenza B by PCR NEGATIVE NEGATIVE Final    Comment: (NOTE) The Xpert Xpress SARS-CoV-2/FLU/RSV plus assay is intended as an aid in the diagnosis of influenza from Nasopharyngeal swab specimens and should not be used as a sole basis for treatment. Nasal washings and aspirates are unacceptable for Xpert Xpress SARS-CoV-2/FLU/RSV testing.  Fact Sheet for Patients: EntrepreneurPulse.com.au  Fact Sheet for Healthcare Providers: IncredibleEmployment.be  This test is not yet approved or cleared by the [place] FDA and has been authorized for detection and/or diagnosis of SARS-CoV-2 by FDA under an Emergency Use Authorization (EUA). This EUA will remain in effect (meaning this test can be used) for the duration of the COVID-19 declaration under Section 564(b)(1) of the Act, 21 U.S.C. section 360bbb-3(b)(1), unless the authorization is terminated or revoked.  Performed at [facility], [address] [place], [place] [address]      Labs: BNP (last 3 results) No results for input(s): "BNP" in the last 8760 hours. Basic Metabolic Panel: Recent Labs  Lab 03/04/22 0612 03/04/22 1222 03/05/22 0124 03/05/22 0557 03/05/22 1341 03/05/22 2028 03/06/22 0707 03/07/22 0507 03/07/22 1428 03/08/22 0513  NA 141  --   --  141  --   --  139 140  --  139  K <2.0*   < > <2.0* <2.0*   < > 3.3* 3.5 4.8 4.8 4.2  CL 113*  --   --  115*  --   --  116* 115*  --  113*  CO2 20*  --   --  22  --   --  20* 21*  --  19*  GLUCOSE 109*  --   --  98  --   --  85 79  --  76  BUN 9  --   --  5*  --   --  <5* 8  --  7  CREATININE 0.50  --   --  0.41*  --   --  <0.30* 0.36*  --  0.43*  CALCIUM 8.4*  --   --  7.8*  --   --  7.7* 8.6*  --  9.0  MG  --    < > 2.0 2.1  --   --  2.2 2.3  --  2.2  PHOS  --   --    --   --   --   --   --  3.1  --  4.3   < > = values in this interval not displayed.   Liver Function Tests: Recent Labs  Lab  03/03/22 2200 03/04/22 0612  AST 274* 259*  ALT 109* 103*  ALKPHOS 67 60  BILITOT 0.6 0.6  PROT 8.1 7.1  ALBUMIN 3.8 3.3*   No results for input(s): "LIPASE", "AMYLASE" in the last 168 hours. No results for input(s): "AMMONIA" in the last 168 hours. CBC: Recent Labs  Lab 03/04/22 0612 03/05/22 0557 03/06/22 0707 03/07/22 0507 03/08/22 0513  WBC 5.8 5.4 4.7 4.3 4.4  HGB 10.7* 9.6* 9.1* 9.5* 10.1*  HCT 34.1* 29.9* 28.9* 31.2* 32.5*  MCV 80.4 79.7* 80.5 83.2 82.3  PLT 312 263 250 274 305   Cardiac Enzymes: Recent Labs  Lab 03/03/22 2200 03/05/22 0557 03/06/22 0707 03/07/22 0507  CKTOTAL 9,338* 13,040* 19,044* 14,021*   BNP: Invalid input(s): "POCBNP" CBG: No results for input(s): "GLUCAP" in the last 168 hours. D-Dimer No results for input(s): "DDIMER" in the last 72 hours. Hgb A1c No results for input(s): "HGBA1C" in the last 72 hours. Lipid Profile No results for input(s): "CHOL", "HDL", "LDLCALC", "TRIG", "CHOLHDL", "LDLDIRECT" in the last 72 hours. Thyroid function studies No results for input(s): "TSH", "T4TOTAL", "T3FREE", "THYROIDAB" in the last 72 hours.  Invalid input(s): "FREET3" Anemia work up No results for input(s): "VITAMINB12", "FOLATE", "FERRITIN", "TIBC", "IRON", "RETICCTPCT" in the last 72 hours.  Urinalysis    Component Value Date/Time   COLORURINE YELLOW (A) 03/04/2022 0409   APPEARANCEUR CLEAR (A) 03/04/2022 0409   LABSPEC 1.006 03/04/2022 0409   PHURINE 8.0 03/04/2022 0409   GLUCOSEU NEGATIVE 03/04/2022 0409   HGBUR MODERATE (A) 03/04/2022 0409   BILIRUBINUR NEGATIVE 03/04/2022 0409   KETONESUR NEGATIVE 03/04/2022 0409   PROTEINUR NEGATIVE 03/04/2022 0409   UROBILINOGEN 0.2 03/20/2008 1000   NITRITE NEGATIVE 03/04/2022 0409   LEUKOCYTESUR SMALL (A) 03/04/2022 0409   Sepsis Labs Recent Labs  Lab  03/05/22 0557 03/06/22 0707 03/07/22 0507 03/08/22 0513  WBC 5.4 4.7 4.3 4.4   Microbiology Recent Results (from the past 240 hour(s))  Resp Panel by RT-PCR (Flu A&B, Covid) Anterior Nasal Swab     Status: None   Collection Time: 03/03/22  9:38 PM   Specimen: Anterior Nasal Swab  Result Value Ref Range Status   SARS Coronavirus 2 by RT PCR NEGATIVE NEGATIVE Final    Comment: (NOTE) SARS-CoV-2 target nucleic acids are NOT DETECTED.  The SARS-CoV-2 RNA is generally detectable in upper respiratory specimens during the acute phase of infection. The lowest concentration of SARS-CoV-2 viral copies this assay can detect is 138 copies/mL. A negative result does not preclude SARS-Cov-2 infection and should not be used as the sole basis for treatment or other patient management decisions. A negative result may occur with  improper specimen collection/handling, submission of specimen other than nasopharyngeal swab, presence of viral mutation(s) within the areas targeted by this assay, and inadequate number of viral copies(<138 copies/mL). A negative result must be combined with clinical observations, patient history, and epidemiological information. The expected result is Negative.  Fact Sheet for Patients:  EntrepreneurPulse.com.au  Fact Sheet for Healthcare Providers:  IncredibleEmployment.be  This test is no t yet approved or cleared by the [place] FDA and  has been authorized for detection and/or diagnosis of SARS-CoV-2 by FDA under an Emergency Use Authorization (EUA). This EUA will remain  in effect (meaning this test can be used) for the duration of the COVID-19 declaration under Section 564(b)(1) of the Act, 21 U.S.C.section 360bbb-3(b)(1), unless the authorization is terminated  or revoked sooner.       Influenza A by  PCR NEGATIVE NEGATIVE Final   Influenza B by PCR NEGATIVE NEGATIVE Final    Comment: (NOTE) The Xpert Xpress  SARS-CoV-2/FLU/RSV plus assay is intended as an aid in the diagnosis of influenza from Nasopharyngeal swab specimens and should not be used as a sole basis for treatment. Nasal washings and aspirates are unacceptable for Xpert Xpress SARS-CoV-2/FLU/RSV testing.  Fact Sheet for Patients: EntrepreneurPulse.com.au  Fact Sheet for Healthcare Providers: IncredibleEmployment.be  This test is not yet approved or cleared by the [place] FDA and has been authorized for detection and/or diagnosis of SARS-CoV-2 by FDA under an Emergency Use Authorization (EUA). This EUA will remain in effect (meaning this test can be used) for the duration of the COVID-19 declaration under Section 564(b)(1) of the Act, 21 U.S.C. section 360bbb-3(b)(1), unless the authorization is terminated or revoked.  Performed at [facility], [address] [place], [place] [address]      Time coordinating discharge: Over 30 minutes  SIGNED:   Shawna Clamp, MD  [facility] 03/08/2022, 2:21 PM Pager   If 7PM-7AM, please contact night-coverage

## 2022-03-15 LAB — ALDOSTERONE + RENIN ACTIVITY W/ RATIO
ALDO / PRA Ratio: <2 (ref 0.0–30.0)
Aldosterone: <1 ng/dL (ref 0.0–30.0)
PRA LC/MS/MS: 0.496 ng/mL/hr (ref 0.167–5.380)

## 2022-03-20 ENCOUNTER — Other Ambulatory Visit: Payer: Self-pay

## 2022-03-20 MED ORDER — CYANOCOBALAMIN 1000 MCG/ML IJ SOLN: INTRAMUSCULAR | 10 refills | Status: AC

## 2022-03-20 NOTE — Telephone Encounter (Signed)
Prescription for B12 transmitted to patient's preferred pharmacy. Patient is aware of this.

## 2022-03-20 NOTE — Telephone Encounter (Signed)
Inbound call from patient needing B12 injections state she was hospitalized and was unable to pick them up and pharmacy will not refill. She is requesting a call back to further advise.

## 2023-11-24 IMAGING — US US OB < 14 WEEKS - US OB TV
1 series · 13 of 28 positions shown · non-contrast
Comparison: None.

CLINICAL DATA: First trimester bleeding.

EXAM:
OBSTETRIC <14 WK US AND TRANSVAGINAL OB US
TECHNIQUE: Both transabdominal and transvaginal ultrasound examinations were
performed for complete evaluation of the gestation as well as the
maternal uterus, adnexal regions, and pelvic cul-de-sac.
Transvaginal technique was performed to assess early pregnancy.

[Series 1: us ob less than 14 weeks with ob transvaginal · 81 acquisitions, 13 frames shown]
[im 3/81]
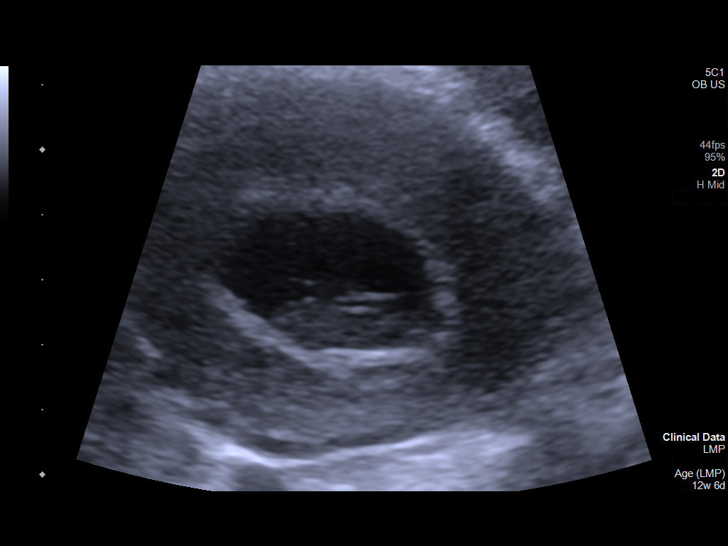
[im 9/81]
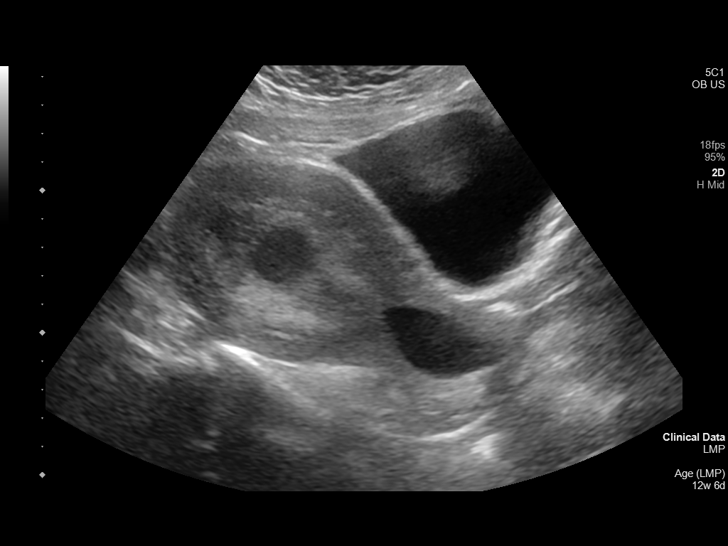
[im 15/81]
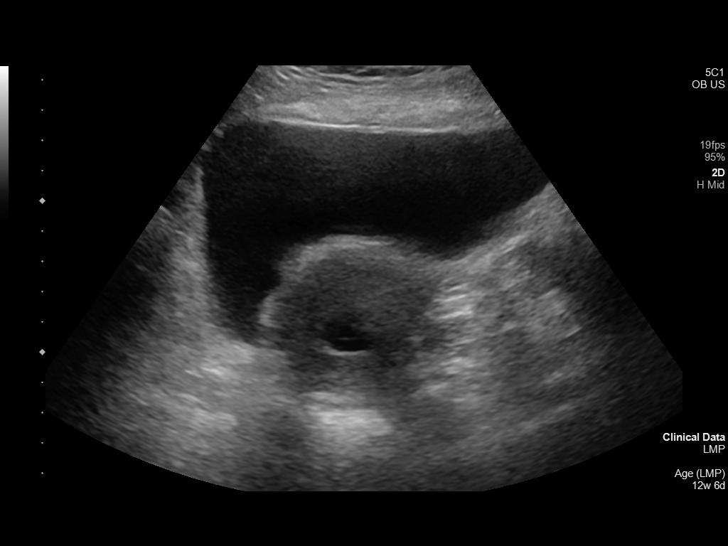
[im 21/81]
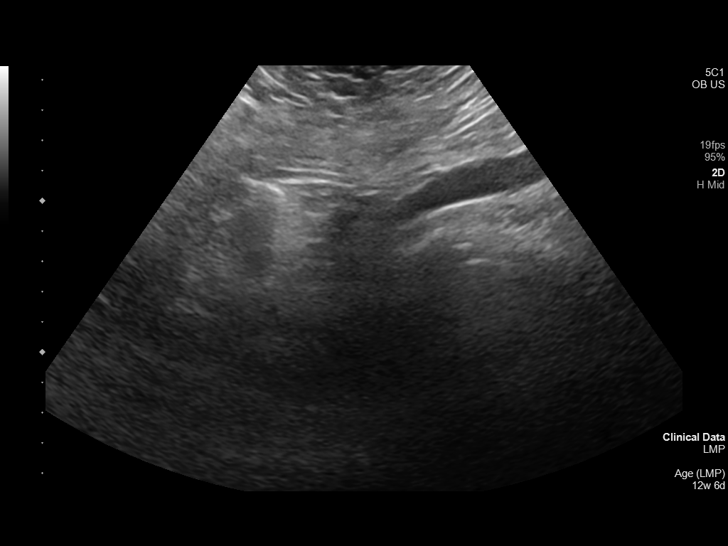
[im 27/81]
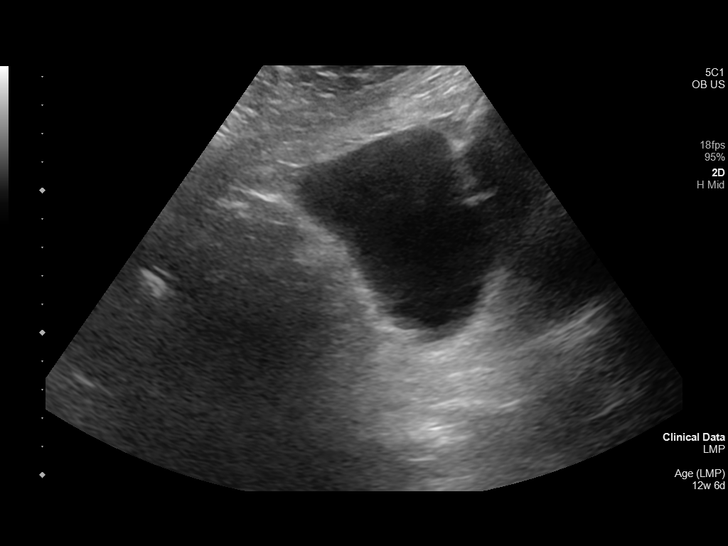
[im 33/81]
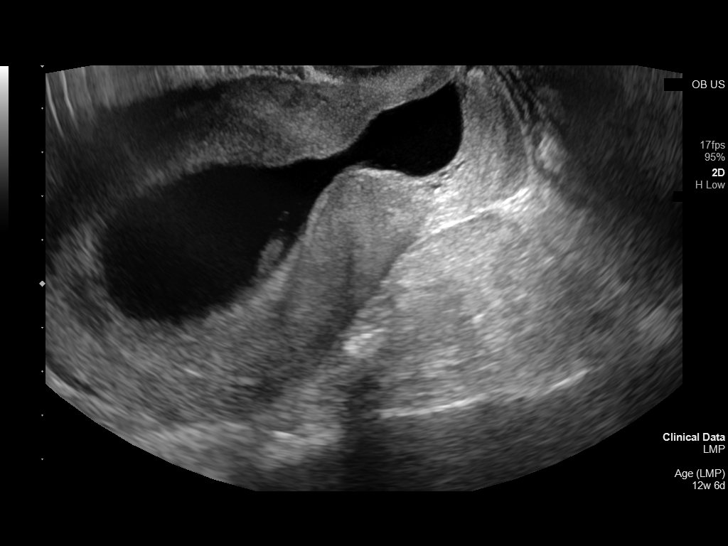
[im 42/81]
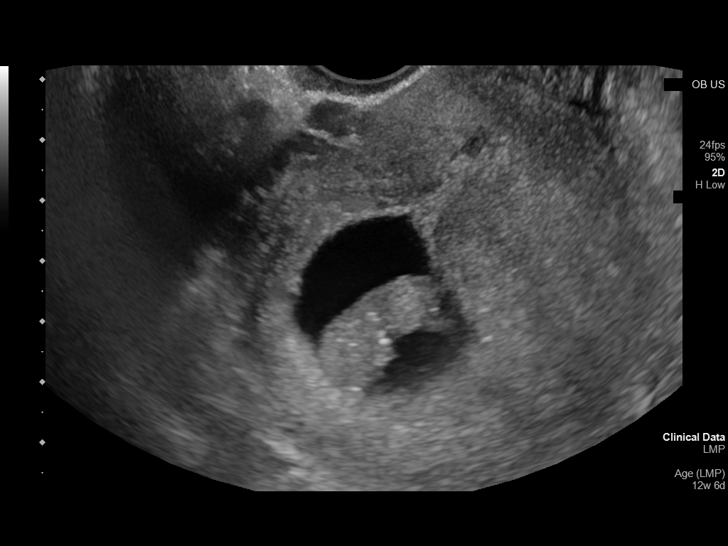
[im 48/81]
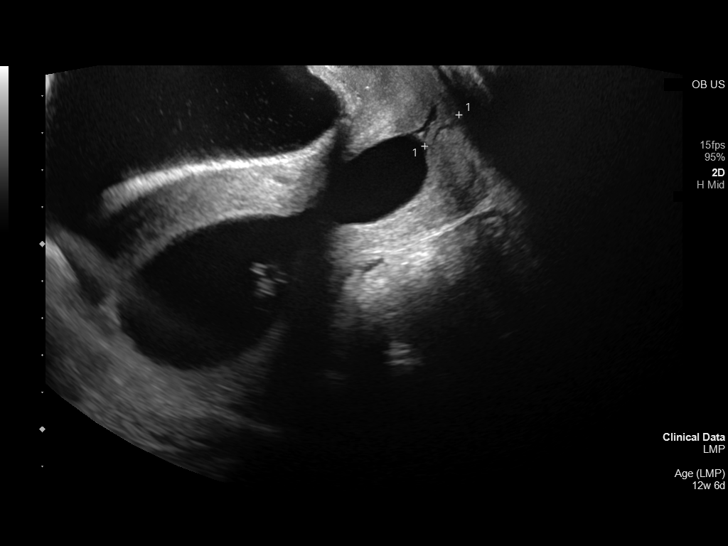
[im 54/81]
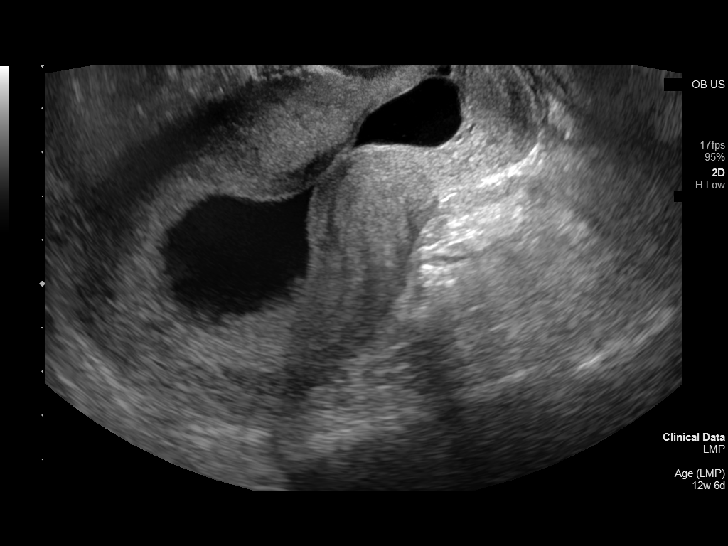
[im 60/81]
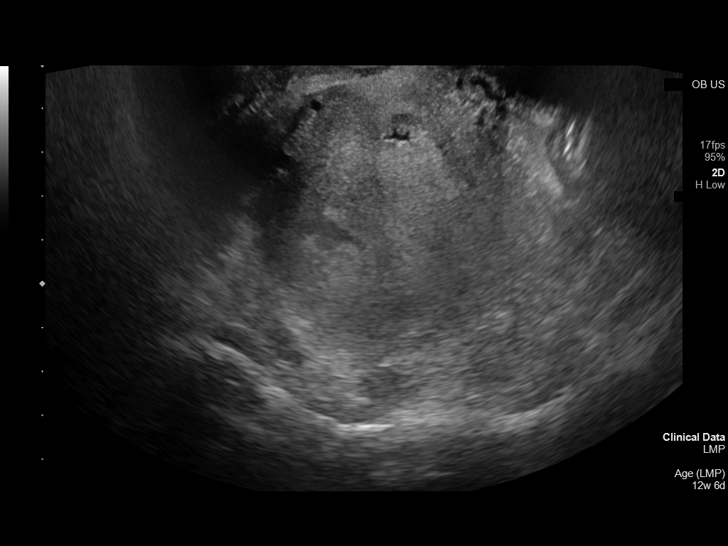
[im 66/81]
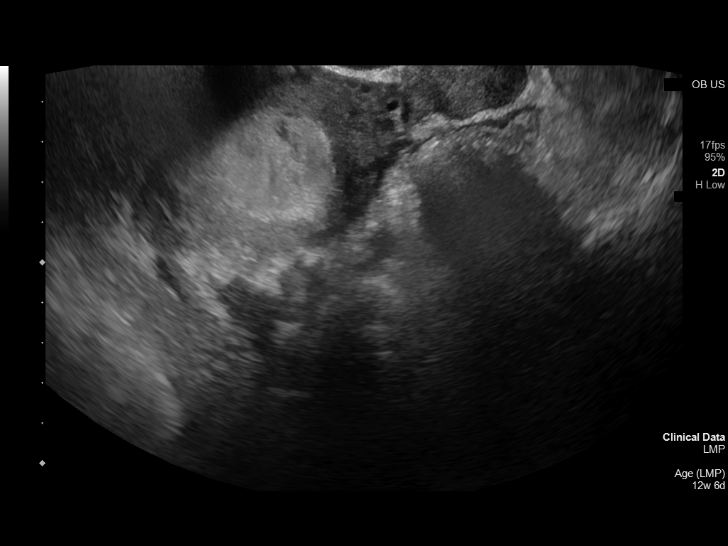
[im 72/81]
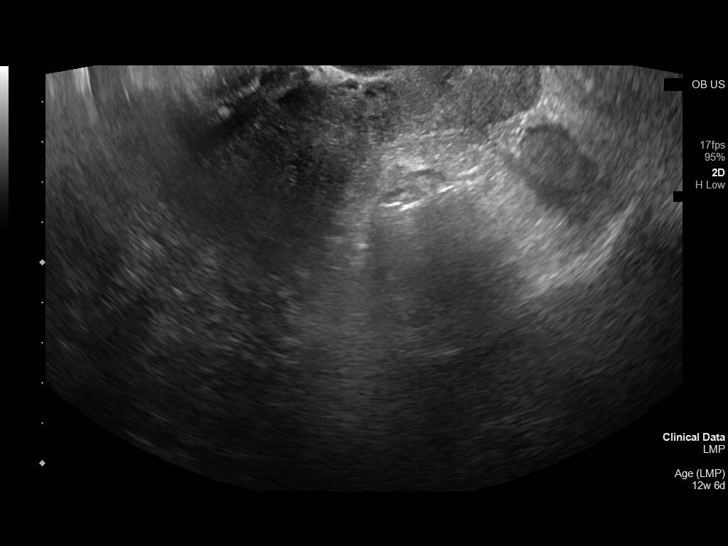
[im 78/81]
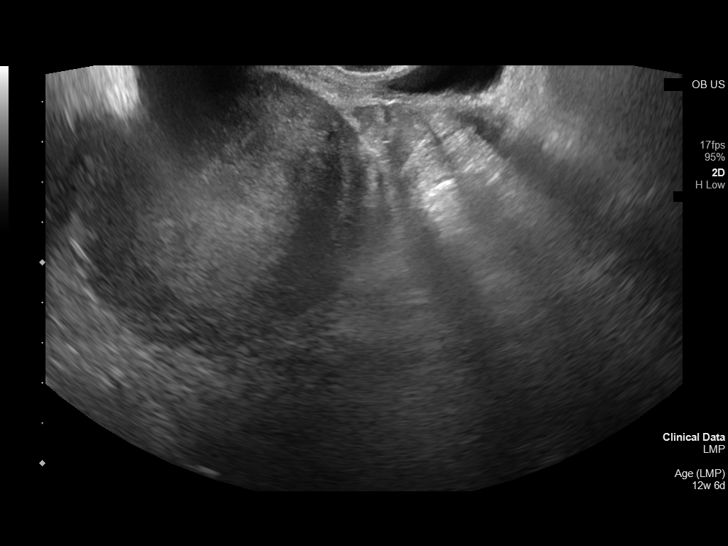

[13 of 28 positions shown; findings below may reference images not displayed]

FINDINGS: Intrauterine gestational sac: Single

Yolk sac:  Not Visualized.

Embryo:  Visualized.

Cardiac Activity: Not Visualized.

Heart Rate: N/a bpm

MSD: Not measured due to pregnancy age.

CRL:  23.9 mm   9 w   1 d +/-6 days,  US EDC: 12/09/2021

Clinical age: 12 weeks 6 days by LMP.

Subchorionic hemorrhage:  None visualized.

Maternal uterus/adnexae: The uterus is anteverted and demonstrates
no wall mass. There is shortening of the cervix length to 1.2 cm
with the upper to mid cervix both open including internal os, to
cm, with prolapse of the membranes into the cervix. Neither ovary
was visualized. No free fluid is seen.
IMPRESSION: Pulseless fetus measuring 9 weeks 1 day consistent with intrauterine
fetal demise, with shortened cervix and open internal os and
proximal cervix containing prolapsed membranes. Findings most likely
representing an abortion in progress. Nonvisualization of the
ovaries.
